# Patient Record
Sex: Male | Born: 1975 | Race: White | Hispanic: No | Marital: Single | Smoking: Never smoker
Health system: Southern US, Community
[De-identification: ages and names within clinical notes are randomized; demographics above are authoritative.]

## PROBLEM LIST (undated history)

## (undated) DIAGNOSIS — F410 Panic disorder [episodic paroxysmal anxiety] without agoraphobia: Secondary | ICD-10-CM

## (undated) DIAGNOSIS — F1910 Other psychoactive substance abuse, uncomplicated: Secondary | ICD-10-CM

## (undated) DIAGNOSIS — F419 Anxiety disorder, unspecified: Secondary | ICD-10-CM

## (undated) HISTORY — PX: HERNIA REPAIR: SHX51

---

## 2009-09-25 ENCOUNTER — Emergency Department (HOSPITAL_COMMUNITY): Admission: EM | Admit: 2009-09-25 | Discharge: 2009-09-27 | Payer: Self-pay | Admitting: Emergency Medicine

## 2010-04-15 LAB — POCT I-STAT, CHEM 8
Creatinine, Ser: 1.3 mg/dL (ref 0.4–1.5)
Glucose, Bld: 111 mg/dL — ABNORMAL HIGH (ref 70–99)
HCT: 36 % — ABNORMAL LOW (ref 39.0–52.0)
Hemoglobin: 12.2 g/dL — ABNORMAL LOW (ref 13.0–17.0)
Potassium: 3.4 mEq/L — ABNORMAL LOW (ref 3.5–5.1)
Sodium: 139 mEq/L (ref 135–145)
TCO2: 28 mmol/L (ref 0–100)

## 2010-04-15 LAB — RAPID URINE DRUG SCREEN, HOSP PERFORMED
Amphetamines: NOT DETECTED
Barbiturates: NOT DETECTED
Benzodiazepines: NOT DETECTED
Tetrahydrocannabinol: NOT DETECTED

## 2010-12-28 ENCOUNTER — Other Ambulatory Visit (HOSPITAL_COMMUNITY): Payer: Self-pay

## 2010-12-28 ENCOUNTER — Encounter: Payer: Self-pay | Admitting: *Deleted

## 2010-12-28 ENCOUNTER — Emergency Department (HOSPITAL_COMMUNITY): Payer: No Typology Code available for payment source

## 2010-12-28 ENCOUNTER — Emergency Department (HOSPITAL_COMMUNITY)
Admission: EM | Admit: 2010-12-28 | Discharge: 2010-12-28 | Disposition: A | Discharge status: Home / Self Care | Payer: No Typology Code available for payment source | Attending: Emergency Medicine | Admitting: Emergency Medicine

## 2010-12-28 ENCOUNTER — Other Ambulatory Visit (HOSPITAL_COMMUNITY): Payer: Self-pay | Admitting: Family Medicine

## 2010-12-28 DIAGNOSIS — K37 Unspecified appendicitis: Secondary | ICD-10-CM

## 2010-12-28 DIAGNOSIS — N134 Hydroureter: Secondary | ICD-10-CM | POA: Insufficient documentation

## 2010-12-28 DIAGNOSIS — R109 Unspecified abdominal pain: Secondary | ICD-10-CM | POA: Insufficient documentation

## 2010-12-28 HISTORY — DX: Anxiety disorder, unspecified: F41.9

## 2010-12-28 HISTORY — DX: Panic disorder (episodic paroxysmal anxiety): F41.0

## 2010-12-28 LAB — COMPREHENSIVE METABOLIC PANEL WITH GFR
BUN: 12 mg/dL (ref 6–23)
CO2: 26 meq/L (ref 19–32)
Calcium: 10.3 mg/dL (ref 8.4–10.5)
Creatinine, Ser: 1 mg/dL (ref 0.50–1.35)
GFR calc Af Amer: >90 mL/min (ref >90)
GFR calc non Af Amer: >90 mL/min (ref >90)
Glucose, Bld: 103 mg/dL — ABNORMAL HIGH (ref 70–99)
Total Bilirubin: 0.3 mg/dL (ref 0.3–1.2)

## 2010-12-28 LAB — COMPREHENSIVE METABOLIC PANEL
ALT: 25 U/L (ref 0–53)
AST: 23 U/L (ref 0–37)
Albumin: 4.6 g/dL (ref 3.5–5.2)
Alkaline Phosphatase: 46 U/L (ref 39–117)
Chloride: 105 mEq/L (ref 96–112)
Potassium: 4 mEq/L (ref 3.5–5.1)
Sodium: 142 mEq/L (ref 135–145)
Total Protein: 7.9 g/dL (ref 6.0–8.3)

## 2010-12-28 LAB — URINALYSIS, ROUTINE W REFLEX MICROSCOPIC
Bilirubin Urine: NEGATIVE
Glucose, UA: NEGATIVE mg/dL
Hgb urine dipstick: NEGATIVE
Ketones, ur: NEGATIVE mg/dL
Leukocytes, UA: NEGATIVE
Nitrite: NEGATIVE
Protein, ur: NEGATIVE mg/dL
Specific Gravity, Urine: 1.008 (ref 1.005–1.030)
Urobilinogen, UA: 0.2 mg/dL (ref 0.0–1.0)
pH: 7 (ref 5.0–8.0)

## 2010-12-28 LAB — CBC
HCT: 40.5 % (ref 39.0–52.0)
Hemoglobin: 14.7 g/dL (ref 13.0–17.0)
MCH: 31.8 pg (ref 26.0–34.0)
MCHC: 36.3 g/dL — ABNORMAL HIGH (ref 30.0–36.0)
MCV: 87.7 fL (ref 78.0–100.0)
Platelets: 204 10*3/uL (ref 150–400)
RBC: 4.62 MIL/uL (ref 4.22–5.81)
RDW: 12.4 % (ref 11.5–15.5)
WBC: 11.9 K/uL — ABNORMAL HIGH (ref 4.0–10.5)

## 2010-12-28 LAB — DIFFERENTIAL
Basophils Absolute: 0 K/uL (ref 0.0–0.1)
Basophils Relative: 0 % (ref 0–1)
Eosinophils Absolute: 0.1 10*3/uL (ref 0.0–0.7)
Eosinophils Relative: 0 % (ref 0–5)
Lymphocytes Relative: 14 % (ref 12–46)
Lymphs Abs: 1.6 K/uL (ref 0.7–4.0)
Monocytes Absolute: 0.6 K/uL (ref 0.1–1.0)
Monocytes Relative: 5 % (ref 3–12)
Neutro Abs: 9.6 10*3/uL — ABNORMAL HIGH (ref 1.7–7.7)
Neutrophils Relative %: 81 % — ABNORMAL HIGH (ref 43–77)

## 2010-12-28 LAB — LIPASE, BLOOD: Lipase: 130 U/L — ABNORMAL HIGH (ref 11–59)

## 2010-12-28 MED ORDER — IOHEXOL 300 MG/ML  SOLN
80.0000 mL | Freq: Once | INTRAMUSCULAR | Status: AC | PRN
  Administered 2010-12-28: 80 mL via INTRAVENOUS

## 2010-12-28 MED ORDER — HYDROMORPHONE HCL PF 1 MG/ML IJ SOLN
1.0000 mg | Freq: Once | INTRAMUSCULAR | Status: AC
  Administered 2010-12-28: 1 mg via INTRAVENOUS
  Filled 2010-12-28: qty 1

## 2010-12-28 MED ORDER — HYDROCODONE-ACETAMINOPHEN 5-500 MG PO TABS: 1.0000 | ORAL_TABLET | Freq: Four times a day (QID) | ORAL | Status: AC | PRN

## 2010-12-28 MED ORDER — ONDANSETRON HCL 4 MG/2ML IJ SOLN
4.0000 mg | Freq: Once | INTRAMUSCULAR | Status: AC
  Administered 2010-12-28: 4 mg via INTRAVENOUS
  Filled 2010-12-28: qty 2

## 2010-12-28 MED ORDER — LORAZEPAM 2 MG/ML IJ SOLN
0.5000 mg | Freq: Once | INTRAMUSCULAR | Status: AC
  Administered 2010-12-28: 0.5 mg via INTRAVENOUS
  Filled 2010-12-28: qty 1

## 2010-12-28 MED ORDER — SODIUM CHLORIDE 0.9 % IV SOLN
Freq: Once | INTRAVENOUS | Status: AC
  Administered 2010-12-28: 15:00:00 via INTRAVENOUS

## 2010-12-28 NOTE — ED Notes (Signed)
Pt states "I had pain that doubled me over, I went to Urgent Care & they drew some blood, told me my WBC's were elevated, I needed a CT scan and were 95% sure it was appenditicitis"

## 2010-12-28 NOTE — ED Notes (Signed)
Patient transported to CT 

## 2010-12-28 NOTE — ED Provider Notes (Signed)
History     CSN: [id number] Arrival date & time: 12/28/2010  2:03 PM   First MD Initiated Contact with Patient 12/28/10 1432      Chief Complaint  Patient presents with  . Abdominal Pain    (Consider location/radiation/quality/duration/timing/severity/associated sxs/prior treatment) HPI  Past Medical History  Diagnosis Date  . Anxiety   . Panic attacks     Past Surgical History  Procedure Date  . Hernia repair     No family history on file.  History  Substance Use Topics  . Smoking status: Former Games developer  . Smokeless tobacco: Not on file  . Alcohol Use: No      Review of Systems  Allergies  Review of patient's allergies indicates no known allergies.  Home Medications   Current Outpatient Rx  Name Route Sig Dispense Refill  . BUPROPION HCL ER (SR) 150 MG PO TB12 Oral Take 150 mg by mouth 2 (two) times daily.      Marland Kitchen CLONAZEPAM 1 MG PO TABS Oral Take 1 mg by mouth 2 (two) times daily.     Carma Leaven M PLUS PO TABS Oral Take 1 tablet by mouth daily. Complete Man     . QUETIAPINE FUMARATE 50 MG PO TABS Oral Take 50 mg by mouth at bedtime.        BP 121/71  Pulse 72  Temp(Src) 98.5 F (36.9 C) (Oral)  Resp 18  Wt 177 lb (80.287 kg)  SpO2 98%  Physical Exam  ED Course  Procedures (including critical care time)  Labs Reviewed  CBC - Abnormal; Notable for the following:    WBC 11.9 (*)    MCHC 36.3 (*)    All other components within normal limits  DIFFERENTIAL - Abnormal; Notable for the following:    Neutrophils Relative 81 (*)    Neutro Abs 9.6 (*)    All other components within normal limits  COMPREHENSIVE METABOLIC PANEL - Abnormal; Notable for the following:    Glucose, Bld 103 (*)    All other components within normal limits  LIPASE, BLOOD - Abnormal; Notable for the following:    Lipase 130 (*)    All other components within normal limits  URINALYSIS, ROUTINE W REFLEX MICROSCOPIC   Ct Abdomen Pelvis W Contrast  12/28/2010  *RADIOLOGY  REPORT*  Clinical Data: Right-sided abdominal pain.  CT ABDOMEN AND PELVIS WITH CONTRAST  Technique:  Multidetector CT imaging of the abdomen and pelvis was performed following the standard protocol during bolus administration of intravenous contrast.  Contrast: 80mL OMNIPAQUE IOHEXOL 300 MG/ML IV SOLN  Comparison: None.  Findings: The liver, spleen, pancreas, and adrenal glands appear unremarkable.  The gallbladder and biliary system appear unremarkable.  Bilateral extrarenal pelvis noted with borderline fullness of the collecting system but no caliectasis. Borderline right proximal hydroureter noted.  Prominent urinary bladder fills the pelvis, with volume of bladder estimated at 570 ml.  No appendiceal inflammation is observed.  There is contrast or appendicolith within the appendix.  Bilateral inguinal hernia mesh noted.  No free pelvic fluid is observed.  Prostate gland is normal and sinus.  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  A broad Schmorl's node along the anterior endplate of S1 causes a scalloped appearance.  Diffuse disc bulge noted at the L4-5 level.  IMPRESSION:  1.  Mild collecting system prominence and mild right hydroureter, with a moderately distended urinary bladder.  No overt  hydronephrosis.  No stones identified. 2.  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 3.  Diffuse disc bulge at L4-5, without observed impingement. 4.  The appendix appears unremarkable. 5.  Bilateral prior inguinal hernia repairs without recurrent hernia observed.  Original Report Authenticated By: Dellia Cloud, M.D.     No diagnosis found.    MDM  I assumed care from Dr. Oletta Lamas.  I agree with his note, assessment, and plan as above.  The CT came back without evidence for appendicitis.  There was mild right hydroureter but no sign of kidney stone.  This raises the suspicion that possibly a stone was  passed.  Will treat with pain meds, to return prn.          Geoffery Lyons, MD 12/28/10 [phone]

## 2010-12-28 NOTE — ED Provider Notes (Signed)
History     CSN: [id number] Arrival date & time: 12/28/2010  2:03 PM   First MD Initiated Contact with Patient 12/28/10 1432      Chief Complaint  Patient presents with  . Abdominal Pain    (Consider location/radiation/quality/duration/timing/severity/associated sxs/prior treatment) HPI Comments: Patient reports that he was woken at approximately 6:30 this morning do to diffuse abdominal pain. He had some mild nausea but no vomiting. She reports that eventually he had to go see the urgent care to 2 more severe pain. He reports pain is much worse with moving around or tries to sit up using his abdominal muscles. He denies any diarrhea or constipation. He denies any fevers or chills. His appetite is mildly decreased. There is a did a plain x-rays as well as blood tests and told him that his white count was mildly elevated and that they suspected that he might have appendicitis and directed him here to the emergency department. He reports pain is somewhat improved since it began although it is still rather moderate to severe in nature. It does not radiate to his back. He says it mildly radiates to his groin area in the suprapubic and right groin region but denies testicular or scrotal pain. Denies hematuria or dysuria. He denies any recent trauma. He has a significant history of anxiety and depression, and he also has had bilateral inguinal hernia surgeries a couple years ago in [place]. He reports that he is a former smoker he does not drink alcohol.  Patient is a 35 y.o. male presenting with abdominal pain. The history is provided by the patient.  Abdominal Pain The primary symptoms of the illness include abdominal pain and nausea. The primary symptoms of the illness do not include fever, shortness of breath or vomiting.  Symptoms associated with the illness do not include chills, hematuria, frequency or back pain.    Past Medical History  Diagnosis Date  . Anxiety   . Panic attacks      Past Surgical History  Procedure Date  . Hernia repair     No family history on file.  History  Substance Use Topics  . Smoking status: Former Games developer  . Smokeless tobacco: Not on file  . Alcohol Use: No      Review of Systems  Constitutional: Negative for fever and chills.  HENT: Negative for congestion and rhinorrhea.   Respiratory: Negative for shortness of breath.   Gastrointestinal: Positive for nausea and abdominal pain. Negative for vomiting, blood in stool and rectal pain.  Genitourinary: Negative for frequency, hematuria, flank pain, scrotal swelling and testicular pain.  Musculoskeletal: Negative for back pain.  All other systems reviewed and are negative.    Allergies  Review of patient's allergies indicates no known allergies.  Home Medications   Current Outpatient Rx  Name Route Sig Dispense Refill  . BUPROPION HCL ER (SR) 150 MG PO TB12 Oral Take 150 mg by mouth 2 (two) times daily.      Marland Kitchen CLONAZEPAM 1 MG PO TABS Oral Take 1 mg by mouth 2 (two) times daily.     Carma Leaven M PLUS PO TABS Oral Take 1 tablet by mouth daily. Complete Man     . QUETIAPINE FUMARATE 50 MG PO TABS Oral Take 50 mg by mouth at bedtime.      Marland Kitchen HYDROCODONE-ACETAMINOPHEN 5-500 MG PO TABS Oral Take 1-2 tablets by mouth every 6 (six) hours as needed for pain. 15 tablet 0    BP 121/71  Pulse 72  Temp(Src) 98.5 F (36.9 C) (Oral)  Resp 18  Wt 177 lb (80.287 kg)  SpO2 98%  Physical Exam  Nursing note and vitals reviewed. Constitutional: He appears well-developed and well-nourished.  HENT:  Head: Normocephalic and atraumatic.  Eyes: Pupils are equal, round, and reactive to light. No scleral icterus.  Abdominal: Soft. Bowel sounds are normal. There is tenderness in the right lower quadrant, suprapubic area and left lower quadrant. There is rebound, guarding and tenderness at McBurney's point. There is no CVA tenderness. No hernia.  Psychiatric: He has a normal mood and affect. His  speech is normal. Thought content normal.    ED Course  Procedures (including critical care time)  Labs Reviewed  CBC - Abnormal; Notable for the following:    WBC 11.9 (*)    MCHC 36.3 (*)    All other components within normal limits  DIFFERENTIAL - Abnormal; Notable for the following:    Neutrophils Relative 81 (*)    Neutro Abs 9.6 (*)    All other components within normal limits  COMPREHENSIVE METABOLIC PANEL - Abnormal; Notable for the following:    Glucose, Bld 103 (*)    All other components within normal limits  LIPASE, BLOOD - Abnormal; Notable for the following:    Lipase 130 (*)    All other components within normal limits  URINALYSIS, ROUTINE W REFLEX MICROSCOPIC  LAB REPORT - SCANNED   Ct Abdomen Pelvis W Contrast  12/28/2010  *RADIOLOGY REPORT*  Clinical Data: Right-sided abdominal pain.  CT ABDOMEN AND PELVIS WITH CONTRAST  Technique:  Multidetector CT imaging of the abdomen and pelvis was performed following the standard protocol during bolus administration of intravenous contrast.  Contrast: 80mL OMNIPAQUE IOHEXOL 300 MG/ML IV SOLN  Comparison: None.  Findings: The liver, spleen, pancreas, and adrenal glands appear unremarkable.  The gallbladder and biliary system appear unremarkable.  Bilateral extrarenal pelvis noted with borderline fullness of the collecting system but no caliectasis. Borderline right proximal hydroureter noted.  Prominent urinary bladder fills the pelvis, with volume of bladder estimated at 570 ml.  No appendiceal inflammation is observed.  There is contrast or appendicolith within the appendix.  Bilateral inguinal hernia mesh noted.  No free pelvic fluid is observed.  Prostate gland is normal and sinus.  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  A broad Schmorl's node along the anterior endplate of S1 causes a scalloped appearance.  Diffuse disc bulge noted at the L4-5 level.   IMPRESSION:  1.  Mild collecting system prominence and mild right hydroureter, with a moderately distended urinary bladder.  No overt hydronephrosis.  No stones identified. 2.  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 3.  Diffuse disc bulge at L4-5, without observed impingement. 4.  The appendix appears unremarkable. 5.  Bilateral prior inguinal hernia repairs without recurrent hernia observed.  Original Report Authenticated By: Dellia Cloud, M.D.     1. Abdominal pain       MDM    Patient with some guarding with most of his tenderness in the right lower quadrant at McBurney's point. He does have a positive obturator sign as well. Plan is to obtain blood tests again and ordered a CT scan with contrast to assess for appendicitis. He is afebrile and nontoxic appearing otherwise with normal blood pressure. He is given IV fluids and IV analgesics as well as  IV Zofran for her symptoms at this time.   Pt was signed out to Dr. Judd Lien who assumes care.     Gavin Pound. Keiji Melland, MD 12/29/10 1005

## 2011-05-18 ENCOUNTER — Encounter (HOSPITAL_COMMUNITY): Payer: Self-pay | Admitting: *Deleted

## 2011-05-18 ENCOUNTER — Emergency Department (HOSPITAL_COMMUNITY)
Admission: EM | Admit: 2011-05-18 | Discharge: 2011-05-19 | Disposition: A | Discharge status: Home / Self Care | Payer: No Typology Code available for payment source | Attending: Emergency Medicine | Admitting: Emergency Medicine

## 2011-05-18 DIAGNOSIS — R51 Headache: Secondary | ICD-10-CM | POA: Insufficient documentation

## 2011-05-18 DIAGNOSIS — K5289 Other specified noninfective gastroenteritis and colitis: Secondary | ICD-10-CM | POA: Insufficient documentation

## 2011-05-18 DIAGNOSIS — K529 Noninfective gastroenteritis and colitis, unspecified: Secondary | ICD-10-CM

## 2011-05-18 MED ORDER — SODIUM CHLORIDE 0.9 % IV BOLUS (SEPSIS)
1000.0000 mL | Freq: Once | INTRAVENOUS | Status: AC
  Administered 2011-05-19: 1000 mL via INTRAVENOUS

## 2011-05-18 MED ORDER — ONDANSETRON HCL 4 MG/2ML IJ SOLN
4.0000 mg | Freq: Once | INTRAMUSCULAR | Status: AC
  Administered 2011-05-19: 4 mg via INTRAVENOUS
  Filled 2011-05-18: qty 2

## 2011-05-18 NOTE — ED Notes (Signed)
Patient states that he thinks he has the virus.  Patient has been vomiting today and cannot keep anything down

## 2011-05-19 LAB — COMPREHENSIVE METABOLIC PANEL
Alkaline Phosphatase: 42 U/L (ref 39–117)
BUN: 21 mg/dL (ref 6–23)
Chloride: 98 mEq/L (ref 96–112)
GFR calc Af Amer: >90 mL/min (ref >90)
Glucose, Bld: 99 mg/dL (ref 70–99)
Potassium: 3.8 mEq/L (ref 3.5–5.1)
Total Bilirubin: 0.5 mg/dL (ref 0.3–1.2)
Total Protein: 7.3 g/dL (ref 6.0–8.3)

## 2011-05-19 LAB — DIFFERENTIAL
Eosinophils Absolute: 0 10*3/uL (ref 0.0–0.7)
Lymphs Abs: 1.2 10*3/uL (ref 0.7–4.0)
Monocytes Relative: 10 % (ref 3–12)
Neutrophils Relative %: 81 % — ABNORMAL HIGH (ref 43–77)

## 2011-05-19 LAB — CBC
HCT: 37.1 % — ABNORMAL LOW (ref 39.0–52.0)
Hemoglobin: 13.4 g/dL (ref 13.0–17.0)
MCH: 31.7 pg (ref 26.0–34.0)
RBC: 4.23 MIL/uL (ref 4.22–5.81)

## 2011-05-19 LAB — LIPASE, BLOOD: Lipase: 20 U/L (ref 11–59)

## 2011-05-19 MED ORDER — PROMETHAZINE HCL 25 MG RE SUPP: 25.0000 mg | Freq: Four times a day (QID) | RECTAL | Status: DC | PRN

## 2011-05-19 MED ORDER — PROMETHAZINE HCL 25 MG PO TABS: 25.0000 mg | ORAL_TABLET | Freq: Four times a day (QID) | ORAL | Status: DC | PRN

## 2011-05-19 MED ORDER — SODIUM CHLORIDE 0.9 % IV BOLUS (SEPSIS)
1000.0000 mL | Freq: Once | INTRAVENOUS | Status: AC
  Administered 2011-05-19: 1000 mL via INTRAVENOUS

## 2011-05-19 MED ORDER — PROMETHAZINE HCL 25 MG/ML IJ SOLN
25.0000 mg | Freq: Once | INTRAMUSCULAR | Status: AC
  Administered 2011-05-19: 25 mg via INTRAVENOUS
  Filled 2011-05-19: qty 1

## 2011-05-19 MED ORDER — ONDANSETRON HCL 4 MG/2ML IJ SOLN
4.0000 mg | Freq: Once | INTRAMUSCULAR | Status: AC
  Administered 2011-05-19: 4 mg via INTRAVENOUS
  Filled 2011-05-19: qty 2

## 2011-05-19 MED ORDER — PANTOPRAZOLE SODIUM 40 MG IV SOLR
40.0000 mg | Freq: Once | INTRAVENOUS | Status: AC
  Administered 2011-05-19: 40 mg via INTRAVENOUS
  Filled 2011-05-19: qty 40

## 2011-05-19 NOTE — Discharge Instructions (Signed)
B.R.A.T. Diet Your doctor has recommended the B.R.A.T. diet for you or your child until the condition improves. This is often used to help control diarrhea and vomiting symptoms. If you or your child can tolerate clear liquids, you may have:  Bananas.   Rice.   Applesauce.   Toast (and other simple starches such as crackers, potatoes, noodles).  Be sure to avoid dairy products, meats, and fatty foods until symptoms are better. Fruit juices such as apple, grape, and prune juice can make diarrhea worse. Avoid these. Continue this diet for 2 days or as instructed by your caregiver. Document Released: 01/16/2005 Document Revised: 01/05/2011 Document Reviewed: 07/05/2006 [facility] Patient Information 2012 [place].Clear Liquid Diet The clear liquid dietconsists of foods that are liquid or will become liquid at room temperature.You should be able to see through the liquid and beverages. Examples of foods allowed on a clear liquid diet include fruit juice, broth or bouillon, gelatin, or frozen ice pops. The purpose of this diet is to provide necessary fluid, electrolytes such as sodium and potassium, and energy to keep the body functioning during times when you are not able to consume a regular diet.A clear liquid diet should not be continued for long periods of time as it is not nutritionally adequate.  REASONS FOR USING A CLEAR LIQUID DIET  In sudden onset (acute) conditions for a patient before or after surgery.   As the first step in oral feeding.   For fluid and electrolyte replacement in diarrheal diseases.   As a diet before certain medical tests are performed.  ADEQUACY The clear liquid diet is adequate only in ascorbic acid, according to the Recommended Dietary Allowances of the [employer]. CHOOSING FOODS Breads and Starches  Allowed:  None are allowed.   Avoid: All are avoided.  Vegetables  Allowed:  Strained tomato or vegetable juice.   Avoid: Any  others.  Fruit  Allowed:  Strained fruit juices and fruit drinks. Include 1 serving of citrus or vitamin C-enriched fruit juice daily.   Avoid: Any others.  Meat and Meat Substitutes  Allowed:  None are allowed.   Avoid: All are avoided.  Milk  Allowed:  None are allowed.   Avoid: All are avoided.  Soups and Combination Foods  Allowed:  Clear bouillon, broth, or strained broth-based soups.   Avoid: Any others.  Desserts and Sweets  Allowed:  Sugar, honey. High protein gelatin. Flavored gelatin, ices, or frozen ice pops that do not contain milk.   Avoid: Any others.  Fats and Oils  Allowed:  None are allowed.   Avoid: All are avoided.  Beverages  Allowed: Cereal beverages, coffee (regular or decaffeinated), tea, or soda at the discretion of your caregiver.   Avoid: Any others.  Condiments  Allowed:  Iodized salt.   Avoid: Any others, including pepper.  Supplements  Allowed:  Liquid nutrition beverages.   Avoid: Any others that contain lactose or fiber.  SAMPLE MEAL PLAN Breakfast  4 oz (120 mL) strained orange juice.    to 1 cup (125 to 250 mL) gelatin (plain or fortified).   1 cup (250 mL) beverage (coffee or tea).   Sugar, if desired.  Midmorning Snack   cup (125 mL) gelatin (plain or fortified).  Lunch  1 cup (250 mL) broth or consomm.   4 oz (120 mL) strained grapefruit juice.    cup (125 mL) gelatin (plain or fortified).   1 cup (250 mL) beverage (coffee or tea).  250 mL) beverage (coffee or tea).    Sugar, if desired.   Midmorning Snack    cup (125 mL) gelatin (plain or fortified).   Lunch   1 cup (250 mL) broth or consomm.    4 oz (120 mL) strained grapefruit juice.     cup (125 mL) gelatin (plain or fortified).    1 cup (250 mL) beverage (coffee or tea).    Sugar, if desired.   Midafternoon Snack    cup (125 mL) fruit ice.     cup (125 mL) strained fruit juice.   Dinner   1 cup (250 mL) broth or consomm.     cup (125 mL) cranberry juice.     cup (125 mL) flavored gelatin (plain or fortified).    1 cup (250 mL) beverage (coffee or tea).    Sugar, if desired.   Evening Snack   4 oz (120 mL) strained apple juice (vitamin C-fortified).     cup (125 mL) flavored gelatin (plain or fortified).    Document Released: 01/16/2005 Document Revised: 01/05/2011 Document Reviewed: 04/15/2010  [employer] Patient Information 2012 [employer].    Diet for Diarrhea, Adult  Having frequent, runny stools (diarrhea) has many causes. Diarrhea may be caused or worsened by food or drink. Diarrhea may be relieved by changing your diet.  IF YOU ARE NOT TOLERATING SOLID FOODS:   Drink enough water and fluids to keep your urine clear or pale yellow.    Avoid sugary drinks and sodas as well as milk-based beverages.    Avoid beverages containing caffeine and alcohol.    You may try rehydrating beverages. You can make your own by following this recipe:     tsp table salt.     tsp baking soda.    ? tsp salt substitute (potassium chloride).    1 tbs + 1 tsp sugar.    1 qt water.   As your stools become more solid, you can start eating solid foods. Add foods one at a time. If a certain food causes your diarrhea to get worse, avoid that food and try other foods. A low fiber, low-fat, and lactose-free diet is recommended. Small, frequent meals may be better tolerated.    Starches   Allowed:  White, French, and pita breads, plain rolls, buns, bagels. Plain muffins, matzo. Soda, saltine, or graham crackers. Pretzels, melba toast, zwieback. Cooked cereals made with water: cornmeal, farina, cream cereals. Dry cereals: refined corn, wheat, rice. Potatoes prepared any way without skins, refined macaroni, spaghetti, noodles, refined rice.    Avoid:  Bread, rolls, or crackers made with whole wheat, multi-grains, rye, bran seeds, nuts, or coconut. Corn tortillas or taco shells. Cereals containing whole grains, multi-grains, bran, coconut, nuts, or raisins. Cooked or dry oatmeal. Coarse wheat cereals, granola. Cereals advertised as "high-fiber." Potato skins. Whole grain pasta, wild or brown rice. Popcorn. Sweet potatoes/yams. Sweet rolls, doughnuts, waffles, pancakes, sweet breads.   Vegetables    Allowed: Strained tomato and vegetable juices. Most well-cooked and canned vegetables without seeds. Fresh: Tender lettuce, cucumber without the skin, cabbage, spinach, bean sprouts.    Avoid: Fresh, cooked, or canned: Artichokes, baked beans, beet greens, broccoli, Brussels sprouts, corn, kale, legumes, peas, sweet potatoes. Cooked: Green or red cabbage, spinach. Avoid large servings of any vegetables, because vegetables shrink when cooked, and they contain more fiber per serving than fresh vegetables.   Fruit   Allowed: All fruit juices except prune juice. Cooked or canned: Apricots, applesauce, cantaloupe,   lentils.  Milk  Allowed: Yogurt, lactose-free milk, kefir, drinkable yogurt, buttermilk, soy milk.   Avoid: Milk, chocolate milk, beverages made with milk, such as milk shakes.  Soups  Allowed: Bouillon, broth, or soups made from allowed foods. Any strained soup.   Avoid: Soups made from vegetables that are not allowed, cream or milk-based soups.  Desserts and Sweets  Allowed: Sugar-free gelatin, sugar-free frozen ice pops made without sugar alcohol.   Avoid: Plain cakes and cookies, pie made with allowed fruit, pudding, custard, cream pie. Gelatin, fruit, ice, sherbet, frozen ice pops. Ice cream, ice  milk without nuts. Plain hard candy, honey, jelly, molasses, syrup, sugar, chocolate syrup, gumdrops, marshmallows.  Fats and Oils  Allowed: Avoid any fats and oils.   Avoid: Seeds, nuts, olives, avocados. Margarine, butter, cream, mayonnaise, salad oils, plain salad dressings made from allowed foods. Plain gravy, crisp bacon without rind.  Beverages  Allowed: Water, decaffeinated teas, oral rehydration solutions, sugar-free beverages.   Avoid: Fruit juices, caffeinated beverages (coffee, tea, soda or pop), alcohol, sports drinks, or lemon-lime soda or pop.  Condiments  Allowed: Ketchup, mustard, horseradish, vinegar, cream sauce, cheese sauce, cocoa powder. Spices in moderation: allspice, basil, bay leaves, celery powder or leaves, cinnamon, cumin powder, curry powder, ginger, mace, marjoram, onion or garlic powder, oregano, paprika, parsley flakes, ground pepper, rosemary, sage, savory, tarragon, thyme, turmeric.   Avoid: Coconut, honey.  Weight Monitoring: Weigh yourself every day. You should weigh yourself in the morning after you urinate and before you eat breakfast. Wear the same amount of clothing when you weigh yourself. Record your weight daily. Bring your recorded weights to your clinic visits. Tell your caregiver right away if you have gained 3 lb/1.4 kg or more in 1 day, 5 lb/2.3 kg in a week, or whatever amount you were told to report. SEEK IMMEDIATE MEDICAL CARE IF:   You are unable to keep fluids down.   You start to throw up (vomit) or diarrhea keeps coming back (persistent).   Abdominal pain develops, increases, or can be felt in one place (localizes).   You have an oral temperature above 102 F (38.9 C), not controlled by medicine.   Diarrhea contains blood or mucus.   You develop excessive weakness, dizziness, fainting, or extreme thirst.  MAKE SURE YOU:   Understand these instructions.   Will watch your condition.   Will get help right away if you are not  doing well or get worse.  Document Released: 04/08/2003 Document Revised: 01/05/2011 Document Reviewed: 07/30/2008 [facility] Patient Information 2012 [place], [place].

## 2011-05-19 NOTE — ED Provider Notes (Signed)
Medical screening examination/treatment/procedure(s) were conducted as a shared visit with non-physician practitioner(s) and myself.  I personally evaluated the patient during the encounter  Doug Sou, MD 05/19/11 [phone]

## 2011-05-19 NOTE — ED Notes (Signed)
Patient is resting comfortably. 

## 2011-05-19 NOTE — ED Provider Notes (Signed)
History     CSN: [id number]  Arrival date & time 05/18/11  2143   First MD Initiated Contact with Patient 05/18/11 2345      Chief Complaint  Patient presents with  . Emesis    (Consider location/radiation/quality/duration/timing/severity/associated sxs/prior treatment) HPI Comments: Patient here with a day history of nausea, vomiting and diarrhea - states that his children have all been sick with a "virus" and he thinks that he has caught it too - states multiple (about 8) episodes of NBNB vomiting - has been unable to keep even sips of water or juice down.  Denies fever or chills, denies abdominal pain, blood in vomit or diarrhea - states  No other sick contacts besides children.  Denies recent antibiotic use or travel outside the [place].  Patient is a 36 y.o. male presenting with vomiting. The history is provided by the patient and the spouse. No language interpreter was used.  Emesis  This is a new problem. The current episode started 12 to 24 hours ago. The problem occurs 5 to 10 times per day. The problem has not changed since onset.The emesis has an appearance of stomach contents. There has been no fever. Associated symptoms include diarrhea and headaches. Pertinent negatives include no abdominal pain, no arthralgias, no chills, no cough, no fever, no myalgias, no sweats and no URI. Risk factors include ill contacts.    Past Medical History  Diagnosis Date  . Anxiety   . Panic attacks     Past Surgical History  Procedure Date  . Hernia repair     History reviewed. No pertinent family history.  History  Substance Use Topics  . Smoking status: Former Games developer  . Smokeless tobacco: Not on file  . Alcohol Use: No      Review of Systems  Constitutional: Negative for fever and chills.  Respiratory: Negative for cough.   Gastrointestinal: Positive for vomiting and diarrhea. Negative for abdominal pain.  Musculoskeletal: Negative for myalgias and arthralgias.  Neurological:  Positive for headaches.  All other systems reviewed and are negative.    Allergies  Review of patient's allergies indicates no known allergies.  Home Medications   Current Outpatient Rx  Name Route Sig Dispense Refill  . BUPROPION HCL ER (SR) 150 MG PO TB12 Oral Take 150 mg by mouth 2 (two) times daily.     Marland Kitchen CLONAZEPAM 1 MG PO TABS Oral Take 1 mg by mouth 2 (two) times daily.     Marland Kitchen HYDROCODONE-ACETAMINOPHEN 10-325 MG PO TABS Oral Take 1 tablet by mouth every 6 (six) hours as needed. For pain    . THERA M PLUS PO TABS Oral Take 1 tablet by mouth daily. Complete Man     . QUETIAPINE FUMARATE 50 MG PO TABS Oral Take 50 mg by mouth at bedtime.        BP 106/68  Pulse 91  Temp(Src) 98.3 F (36.8 C) (Oral)  Resp 20  SpO2 97%  Physical Exam  Nursing note and vitals reviewed. Constitutional: He is oriented to person, place, and time. He appears well-developed and well-nourished. No distress.  HENT:  Head: Normocephalic and atraumatic.  Right Ear: External ear normal.  Left Ear: External ear normal.  Nose: Nose normal.  Mouth/Throat: No oropharyngeal exudate.       Dry mucous membranes  Eyes: Conjunctivae are normal. Pupils are equal, round, and reactive to light. No scleral icterus.  Neck: Normal range of motion. Neck supple.  Cardiovascular: Normal rate, regular rhythm and  normal heart sounds.  Exam reveals no gallop and no friction rub.   No murmur heard. Pulmonary/Chest: Effort normal and breath sounds normal. No respiratory distress. He has no wheezes. He has no rales. He exhibits no tenderness.  Abdominal: Soft. Bowel sounds are normal. He exhibits no distension and no mass. There is no tenderness. There is no rebound and no guarding.  Musculoskeletal: Normal range of motion. He exhibits no edema and no tenderness.  Lymphadenopathy:    He has no cervical adenopathy.  Neurological: He is alert and oriented to person, place, and time. No cranial nerve deficit.  Skin: Skin is  warm and dry. No rash noted. No erythema. No pallor.  Psychiatric: He has a normal mood and affect. His behavior is normal. Judgment and thought content normal.    ED Course  Procedures (including critical care time)  Labs Reviewed  CBC - Abnormal; Notable for the following:    WBC 13.1 (*)    HCT 37.1 (*)    MCHC 36.1 (*)    All other components within normal limits  DIFFERENTIAL - Abnormal; Notable for the following:    Neutrophils Relative 81 (*)    Neutro Abs 10.6 (*)    Lymphocytes Relative 9 (*)    Monocytes Absolute 1.3 (*)    All other components within normal limits  COMPREHENSIVE METABOLIC PANEL  LIPASE, BLOOD   No results found.  Results for orders placed during the hospital encounter of 05/18/11  CBC      Component Value Range   WBC 13.1 (*) 4.0 - 10.5 (K/uL)   RBC 4.23  4.22 - 5.81 (MIL/uL)   Hemoglobin 13.4  13.0 - 17.0 (g/dL)   HCT 86.5 (*) 78.4 - 52.0 (%)   MCV 87.7  78.0 - 100.0 (fL)   MCH 31.7  26.0 - 34.0 (pg)   MCHC 36.1 (*) 30.0 - 36.0 (g/dL)   RDW 69.6  29.5 - 28.4 (%)   Platelets 189  150 - 400 (K/uL)  DIFFERENTIAL      Component Value Range   Neutrophils Relative 81 (*) 43 - 77 (%)   Neutro Abs 10.6 (*) 1.7 - 7.7 (K/uL)   Lymphocytes Relative 9 (*) 12 - 46 (%)   Lymphs Abs 1.2  0.7 - 4.0 (K/uL)   Monocytes Relative 10  3 - 12 (%)   Monocytes Absolute 1.3 (*) 0.1 - 1.0 (K/uL)   Eosinophils Relative 0  0 - 5 (%)   Eosinophils Absolute 0.0  0.0 - 0.7 (K/uL)   Basophils Relative 0  0 - 1 (%)   Basophils Absolute 0.0  0.0 - 0.1 (K/uL)  COMPREHENSIVE METABOLIC PANEL      Component Value Range   Sodium 137  135 - 145 (mEq/L)   Potassium 3.8  3.5 - 5.1 (mEq/L)   Chloride 98  96 - 112 (mEq/L)   CO2 24  19 - 32 (mEq/L)   Glucose, Bld 99  70 - 99 (mg/dL)   BUN 21  6 - 23 (mg/dL)   Creatinine, Ser 1.32  0.50 - 1.35 (mg/dL)   Calcium 9.8  8.4 - 44.0 (mg/dL)   Total Protein 7.3  6.0 - 8.3 (g/dL)   Albumin 4.7  3.5 - 5.2 (g/dL)   AST 27  0 - 37  (U/L)   ALT 27  0 - 53 (U/L)   Alkaline Phosphatase 42  39 - 117 (U/L)   Total Bilirubin 0.5  0.3 - 1.2 (mg/dL)  GFR calc non Af Amer >90  >90 (mL/min)   GFR calc Af Amer >90  >90 (mL/min)  LIPASE, BLOOD      Component Value Range   Lipase 20  11 - 59 (U/L)   No results found.   Gastroenteritis     MDM  Patient with no focal findings suggestive of abdominal surgical finding.  Reports no nausea and vomiting after last dose of phenergan - will send home with the same - able to tolerate po fluids.        Izola Price [place], [place] 05/19/11 0330

## 2011-08-09 ENCOUNTER — Encounter (HOSPITAL_COMMUNITY): Payer: Self-pay | Admitting: *Deleted

## 2011-08-09 ENCOUNTER — Emergency Department (HOSPITAL_COMMUNITY)
Admission: EM | Admit: 2011-08-09 | Discharge: 2011-08-09 | Disposition: A | Discharge status: Home / Self Care | Payer: No Typology Code available for payment source | Attending: Emergency Medicine | Admitting: Emergency Medicine

## 2011-08-09 ENCOUNTER — Emergency Department (HOSPITAL_COMMUNITY): Payer: No Typology Code available for payment source

## 2011-08-09 DIAGNOSIS — R300 Dysuria: Secondary | ICD-10-CM | POA: Insufficient documentation

## 2011-08-09 DIAGNOSIS — K5900 Constipation, unspecified: Secondary | ICD-10-CM | POA: Insufficient documentation

## 2011-08-09 DIAGNOSIS — R1031 Right lower quadrant pain: Secondary | ICD-10-CM | POA: Insufficient documentation

## 2011-08-09 LAB — CBC WITH DIFFERENTIAL/PLATELET
Lymphocytes Relative: 20 % (ref 12–46)
Lymphs Abs: 1.6 10*3/uL (ref 0.7–4.0)
MCV: 87.3 fL (ref 78.0–100.0)
Neutro Abs: 5.3 10*3/uL (ref 1.7–7.7)
Neutrophils Relative %: 67 % (ref 43–77)
Platelets: 195 10*3/uL (ref 150–400)
RBC: 4.55 MIL/uL (ref 4.22–5.81)
WBC: 8 10*3/uL (ref 4.0–10.5)

## 2011-08-09 LAB — URINALYSIS, ROUTINE W REFLEX MICROSCOPIC
Hgb urine dipstick: NEGATIVE
Leukocytes, UA: NEGATIVE
Nitrite: NEGATIVE
Specific Gravity, Urine: 1.016 (ref 1.005–1.030)
Urobilinogen, UA: 0.2 mg/dL (ref 0.0–1.0)

## 2011-08-09 LAB — BASIC METABOLIC PANEL
CO2: 22 mEq/L (ref 19–32)
Chloride: 100 mEq/L (ref 96–112)
Glucose, Bld: 94 mg/dL (ref 70–99)
Potassium: 4.2 mEq/L (ref 3.5–5.1)
Sodium: 136 mEq/L (ref 135–145)

## 2011-08-09 MED ORDER — HYDROMORPHONE HCL PF 1 MG/ML IJ SOLN
1.0000 mg | Freq: Once | INTRAMUSCULAR | Status: AC
  Administered 2011-08-09: 1 mg via INTRAVENOUS
  Filled 2011-08-09: qty 1

## 2011-08-09 MED ORDER — HYDROCODONE-ACETAMINOPHEN 5-325 MG PO TABS: 1.0000 | ORAL_TABLET | Freq: Four times a day (QID) | ORAL | Status: AC | PRN

## 2011-08-09 MED ORDER — POLYETHYLENE GLYCOL 3350 17 G PO PACK: 17.0000 g | PACK | Freq: Every day | ORAL | Status: AC

## 2011-08-09 MED ORDER — BISACODYL 5 MG PO TBEC: 5.0000 mg | DELAYED_RELEASE_TABLET | Freq: Two times a day (BID) | ORAL | Status: AC

## 2011-08-09 MED ORDER — ONDANSETRON HCL 4 MG/2ML IJ SOLN
4.0000 mg | Freq: Once | INTRAMUSCULAR | Status: AC
  Administered 2011-08-09: 4 mg via INTRAVENOUS
  Filled 2011-08-09: qty 2

## 2011-08-09 NOTE — ED Notes (Addendum)
Sudden onset of lower abd pain and pain with urination, able to urinated just a little with the pain, has history of kidney stones

## 2011-08-09 NOTE — ED Provider Notes (Signed)
History     CSN: [id number]  Arrival date & time 08/09/11  1611   First MD Initiated Contact with Patient 08/09/11 1753      Chief Complaint  Patient presents with  . Abdominal Pain    (Consider location/radiation/quality/duration/timing/severity/associated sxs/prior treatment) HPI Comments: Patient reports sudden onset of abdominal pain that began as diffuse gassy pain and transitioned quickly into sharp RLQ pain.  Pain does not radiate.  Worse/better depending on position.  Associated dysuria.  Denies fevers, N/V, testicular pain or swelling.  Last BM was yesterday and was normal.  Abdominal surgeries: remote bilateral inguinal hernia repair.    Patient is a 36 y.o. male presenting with abdominal pain. The history is provided by the patient.  Abdominal Pain The primary symptoms of the illness include abdominal pain and dysuria. The primary symptoms of the illness do not include fever, fatigue, shortness of breath, nausea, vomiting or diarrhea. The current episode started 3 to 5 hours ago.  The dysuria is not associated with frequency or urgency.  Symptoms associated with the illness do not include urgency or frequency.    Past Medical History  Diagnosis Date  . Anxiety   . Panic attacks     Past Surgical History  Procedure Date  . Hernia repair     No family history on file.  History  Substance Use Topics  . Smoking status: Never Smoker   . Smokeless tobacco: Not on file  . Alcohol Use: No      Review of Systems  Constitutional: Negative for fever and fatigue.  Respiratory: Negative for shortness of breath.   Gastrointestinal: Positive for abdominal pain. Negative for nausea, vomiting and diarrhea.  Genitourinary: Positive for dysuria. Negative for urgency, frequency, scrotal swelling and testicular pain.    Allergies  Review of patient's allergies indicates no known allergies.  Home Medications   Current Outpatient Rx  Name Route Sig Dispense Refill  .  BUPROPION HCL ER (SR) 150 MG PO TB12 Oral Take 150 mg by mouth 2 (two) times daily.     Marland Kitchen CLONAZEPAM 1 MG PO TABS Oral Take 1 mg by mouth 2 (two) times daily.     Marland Kitchen HYDROCODONE-ACETAMINOPHEN 10-325 MG PO TABS Oral Take 2 tablets by mouth every 6 (six) hours as needed. For pain    . THERA M PLUS PO TABS Oral Take 1 tablet by mouth daily. Complete Man     . QUETIAPINE FUMARATE 50 MG PO TABS Oral Take 50 mg by mouth at bedtime.      Marland Kitchen PROMETHAZINE HCL 25 MG RE SUPP Rectal Place 1 suppository (25 mg total) rectally every 6 (six) hours as needed for nausea. 12 each 0  . PROMETHAZINE HCL 25 MG PO TABS Oral Take 1 tablet (25 mg total) by mouth every 6 (six) hours as needed for nausea. 30 tablet 0    BP 113/75  Pulse 109  Temp 97.7 F (36.5 C) (Oral)  Resp 18  SpO2 100%  Physical Exam  Nursing note and vitals reviewed. Constitutional: He is oriented to person, place, and time. He appears well-developed and well-nourished. No distress.  HENT:  Head: Normocephalic and atraumatic.  Neck: Neck supple.  Cardiovascular: Normal rate, regular rhythm and normal heart sounds.   Pulmonary/Chest: Breath sounds normal. No respiratory distress. He has no wheezes. He has no rales. He exhibits no tenderness.  Abdominal: Soft. Bowel sounds are normal. He exhibits no distension and no mass. There is generalized tenderness. There is no  rebound, no guarding and no CVA tenderness.       Generalized abdominal tenderness, worse on right.    Neurological: He is alert and oriented to person, place, and time.  Skin: He is not diaphoretic.    ED Course  Procedures (including critical care time)  Labs Reviewed  BASIC METABOLIC PANEL - Abnormal; Notable for the following:    GFR calc non Af Amer 80 (*)     All other components within normal limits  URINALYSIS, ROUTINE W REFLEX MICROSCOPIC  CBC WITH DIFFERENTIAL   Ct Abdomen Pelvis Wo Contrast  08/09/2011  *RADIOLOGY REPORT*  Clinical Data: Right flank pain  CT  ABDOMEN AND PELVIS WITHOUT CONTRAST  Technique:  Multidetector CT imaging of the abdomen and pelvis was performed following the standard protocol without intravenous contrast.  Comparison: 12/28/2010  Findings: No hydronephrosis.  No urinary calculus.  The cecum is severely distended and filled with stool.  Extends into the left upper quadrant.  No disproportionate dilatation of small bowel.  No obvious mass in the ascending colon.  Normal appendix.  Bladder and prostate are unremarkable.  Unenhanced liver, gallbladder, spleen, pancreas, adrenal glands are within normal limits.  No destructive bone lesion.  No free fluid.  Grade 1 L5 S1 spondylolisthesis.  IMPRESSION: No evidence of urinary calculus or urinary obstruction.  Marked distention of the cecum which is filled with stool.  Original Report Authenticated By: Donavan Burnet, M.D.   Reexamination of abdomen prior to discharge shows continued diffuse tenderness, worse on right, no guarding, no rebound.    1. Constipation       MDM  Pt with abdominal pain x several hours, initially diffuse and gassy then localized to right side.  Pt with associated dysuria.  Pt does not have UTI and no renal stones.  CT shows significant amount of stool distending the cecum.  Labs unremarkable.  Pt reported improvement with pain medication, abdominal exam remained benign.  Discussed results and return precautions with patient.  Pt tolerating PO, no nausea or vomiting, BM today.   Doubt SBO.  Discussed constipation with patient - pt does have occasional hard stools and difficulty passing stools.  Will attempt stool softeners and laxatives at home, have given some pain medication as well given amount of distension.  PCP follow up - pt has PCP Dr Elesa Massed in [place] he still follows will, requests local resources which I have given.  Pt does have health insurance.  Discussed strict return precautions.  Patient verbalizes understanding and agrees with plan.           Dillard Cannon [place], [place] 08/09/11 2047

## 2011-08-11 NOTE — ED Provider Notes (Signed)
Medical screening examination/treatment/procedure(s) were performed by non-physician practitioner and as supervising physician I was immediately available for consultation/collaboration.    Glenyce Randle R Elye Harmsen, MD 08/11/11 1114 

## 2011-10-21 ENCOUNTER — Emergency Department (HOSPITAL_COMMUNITY)
Admission: EM | Admit: 2011-10-21 | Discharge: 2011-10-21 | Disposition: A | Discharge status: Home / Self Care | Payer: No Typology Code available for payment source | Attending: Emergency Medicine | Admitting: Emergency Medicine

## 2011-10-21 ENCOUNTER — Emergency Department (HOSPITAL_COMMUNITY): Payer: No Typology Code available for payment source

## 2011-10-21 ENCOUNTER — Encounter (HOSPITAL_COMMUNITY): Payer: Self-pay | Admitting: Emergency Medicine

## 2011-10-21 DIAGNOSIS — F411 Generalized anxiety disorder: Secondary | ICD-10-CM | POA: Insufficient documentation

## 2011-10-21 DIAGNOSIS — T148XXA Other injury of unspecified body region, initial encounter: Secondary | ICD-10-CM | POA: Insufficient documentation

## 2011-10-21 DIAGNOSIS — X58XXXA Exposure to other specified factors, initial encounter: Secondary | ICD-10-CM | POA: Insufficient documentation

## 2011-10-21 MED ORDER — HYDROMORPHONE HCL PF 1 MG/ML IJ SOLN
1.0000 mg | Freq: Once | INTRAMUSCULAR | Status: AC
  Administered 2011-10-21: 1 mg via INTRAMUSCULAR
  Filled 2011-10-21: qty 1

## 2011-10-21 MED ORDER — HYDROMORPHONE HCL 4 MG PO TABS: ORAL_TABLET | ORAL | Status: DC

## 2011-10-21 NOTE — ED Notes (Signed)
Pt. Has ride home 

## 2011-10-21 NOTE — ED Notes (Signed)
Pt c/o right shoulder pain with some numbness x 4 days and HA starting today

## 2011-10-21 NOTE — ED Provider Notes (Signed)
History     CSN: [id number]  Arrival date & time 10/21/11  5638   First MD Initiated Contact with Patient 10/21/11 2243      Chief Complaint  Patient presents with  . Arm Pain  . Headache    (Consider location/radiation/quality/duration/timing/severity/associated sxs/prior treatment) Patient is a 36 y.o. male presenting with arm pain. The history is provided by the patient.  Arm Pain   36 year old, male, swims for exercise, complains of pain in his scapular area.  He denies direct, trauma.  This pain has been present for several days.  The pain increases, if he moves his arm.  Past Medical History  Diagnosis Date  . Anxiety   . Panic attacks     Past Surgical History  Procedure Date  . Hernia repair     History reviewed. No pertinent family history.  History  Substance Use Topics  . Smoking status: Never Smoker   . Smokeless tobacco: Not on file  . Alcohol Use: Yes     occ      Review of Systems  Musculoskeletal:       Right sided arm/back pain around scapula  Neurological: Negative for weakness.  Hematological: Does not bruise/bleed easily.    Allergies  Review of patient's allergies indicates no known allergies.  Home Medications   Current Outpatient Rx  Name Route Sig Dispense Refill  . BUPROPION HCL ER (SR) 150 MG PO TB12 Oral Take 150 mg by mouth 2 (two) times daily.     Marland Kitchen CLONAZEPAM 1 MG PO TABS Oral Take 1 mg by mouth 2 (two) times daily.     Marland Kitchen HYDROCODONE-ACETAMINOPHEN 10-325 MG PO TABS Oral Take 2 tablets by mouth every 6 (six) hours as needed. For pain    . IBUPROFEN 200 MG PO TABS Oral Take 800 mg by mouth every 6 (six) hours as needed. For pain    . THERA M PLUS PO TABS Oral Take 1 tablet by mouth daily. Complete Man     . QUETIAPINE FUMARATE 50 MG PO TABS Oral Take 50 mg by mouth at bedtime.        BP 114/75  Pulse 89  Temp 98 F (36.7 C) (Oral)  Resp 18  SpO2 100%  Physical Exam  Nursing note and vitals reviewed. Constitutional:  He appears well-developed and well-nourished.  HENT:  Head: Normocephalic and atraumatic.  Eyes: Conjunctivae normal are normal.  Neck: Normal range of motion. Neck supple.  Pulmonary/Chest: Effort normal.  Abdominal: He exhibits no distension.  Musculoskeletal: Normal range of motion. He exhibits tenderness. He exhibits no edema.       Right upper extermity/back No deformity No swelling or bruising + ttp over scapula and medially toward t-spine.   Passive rom does  Not cause pain but active rom causes pain.    ED Course  Procedures (including critical care time)  Labs Reviewed - No data to display Dg Shoulder Right  10/21/2011  *RADIOLOGY REPORT*  Clinical Data: Right shoulder pain progressively worsening.  No injury.  RIGHT SHOULDER - 2+ VIEW  Comparison: None.  Findings: No fracture or dislocation. No significant shoulder degenerative changes.  Small bone island glenoid.  Minimal degenerative changes lower thoracic spine.  Visualized lungs clear.  IMPRESSION: No acute abnormality.   Original Report Authenticated By: Fuller Canada, M.D.      No diagnosis found.    MDM   Muscle strain or tear        Cheri Guppy, MD 10/21/11  2317 

## 2011-10-28 ENCOUNTER — Encounter (HOSPITAL_COMMUNITY): Payer: Self-pay

## 2011-10-28 ENCOUNTER — Emergency Department (HOSPITAL_COMMUNITY)
Admission: EM | Admit: 2011-10-28 | Discharge: 2011-10-28 | Disposition: A | Discharge status: Home / Self Care | Payer: No Typology Code available for payment source | Attending: Emergency Medicine | Admitting: Emergency Medicine

## 2011-10-28 DIAGNOSIS — M549 Dorsalgia, unspecified: Secondary | ICD-10-CM | POA: Insufficient documentation

## 2011-10-28 DIAGNOSIS — M898X1 Other specified disorders of bone, shoulder: Secondary | ICD-10-CM

## 2011-10-28 DIAGNOSIS — R209 Unspecified disturbances of skin sensation: Secondary | ICD-10-CM | POA: Insufficient documentation

## 2011-10-28 DIAGNOSIS — F410 Panic disorder [episodic paroxysmal anxiety] without agoraphobia: Secondary | ICD-10-CM | POA: Insufficient documentation

## 2011-10-28 DIAGNOSIS — M25519 Pain in unspecified shoulder: Secondary | ICD-10-CM | POA: Insufficient documentation

## 2011-10-28 DIAGNOSIS — G8929 Other chronic pain: Secondary | ICD-10-CM | POA: Insufficient documentation

## 2011-10-28 MED ORDER — PREDNISONE 20 MG PO TABS: ORAL_TABLET | ORAL | Status: DC

## 2011-10-28 MED ORDER — TRAMADOL-ACETAMINOPHEN 37.5-325 MG PO TABS
ORAL_TABLET | ORAL | Status: DC
Start: 2011-10-28 — End: 2011-10-28

## 2011-10-28 MED ORDER — TRAMADOL-ACETAMINOPHEN 37.5-325 MG PO TABS: ORAL_TABLET | ORAL | Status: DC

## 2011-10-28 MED ORDER — FAMOTIDINE 20 MG PO TABS: 20.0000 mg | ORAL_TABLET | Freq: Two times a day (BID) | ORAL | Status: DC

## 2011-10-28 MED ORDER — CYCLOBENZAPRINE HCL 10 MG PO TABS
10.0000 mg | ORAL_TABLET | Freq: Once | ORAL | Status: AC
  Administered 2011-10-28: 10 mg via ORAL
  Filled 2011-10-28: qty 1

## 2011-10-28 MED ORDER — CYCLOBENZAPRINE HCL 10 MG PO TABS: 10.0000 mg | ORAL_TABLET | Freq: Three times a day (TID) | ORAL | Status: DC | PRN

## 2011-10-28 MED ORDER — TRAMADOL HCL 50 MG PO TABS
100.0000 mg | ORAL_TABLET | Freq: Once | ORAL | Status: AC
  Administered 2011-10-28: 100 mg via ORAL
  Filled 2011-10-28: qty 2

## 2011-10-28 MED ORDER — ACETAMINOPHEN 325 MG PO TABS
650.0000 mg | ORAL_TABLET | Freq: Once | ORAL | Status: AC
  Administered 2011-10-28: 650 mg via ORAL
  Filled 2011-10-28: qty 2

## 2011-10-28 MED ORDER — TRAMADOL HCL 50 MG PO TABS: 100.0000 mg | ORAL_TABLET | Freq: Four times a day (QID) | ORAL | Status: DC | PRN

## 2011-10-28 MED ORDER — PREDNISONE 20 MG PO TABS
60.0000 mg | ORAL_TABLET | Freq: Once | ORAL | Status: AC
  Administered 2011-10-28: 60 mg via ORAL
  Filled 2011-10-28: qty 3

## 2011-10-28 NOTE — ED Notes (Signed)
  ptcomplains of right arm pain, hx of recent pulled muscle in same arm and the pain is not able to get under control. Was seen last Saturday

## 2011-10-28 NOTE — ED Provider Notes (Signed)
History  This chart was scribed for Ward Givens, MD by Bennett Scrape. This patient was seen in room TR11C/TR11C and the patient's care was started at 4:45PM.  CSN: [id number]  Arrival date & time 10/28/11  1405   None     Chief Complaint  Patient presents with  . Arm Pain     The history is provided by the patient. No language interpreter was used.    Juan Robinson is a 36 y.o. male who presents to the Emergency Department complaining of 7 days of sudden onset, non-changing, constant right shoulder pain that radiates down the outer side of the right arm described as burning with associated right hand tingling that started while he was swimming at the [facility]. He denies hearing a pop or crack and states that the pain is worse with movement of the right shoulder. He reports that he was seen in this ED 7 days ago (10/21/11), had a negative x-ray of the right shoulder and was told to follow up with his PCP for a possible muscle strain which he has yet to do.He reports his pain isn't worse it is still there and he has run out of the dilaudid he was prescribed. He describes a stocking glove numbness of his right hand from the wrist down, but denies loss of motor strength.  He reports chronic back pain but denies neck pain and arm weakness as associated symptoms. He has a h/o anxiety and panic attacks. He is an occasional alcohol user but denies smoking.  Dr. Christell Constant is PCP but pt states that his PCP recently moved and he needs to find a new PCP. Pt did not report he is seeing Dr. Ethelene Hal for chronic back pain and reports that he gets hydrocodone and soma once a month but states that they don't work.     Past Medical History  Diagnosis Date  . Anxiety   . Panic attacks     Past Surgical History  Procedure Date  . Hernia repair     No family history on file.  History  Substance Use Topics  . Smoking status: Never Smoker   . Smokeless tobacco: Not on file  . Alcohol Use: Yes     occ    Works as a Teacher, music    Review of Systems  HENT: Negative for neck pain and neck stiffness.   Musculoskeletal: Positive for back pain (chronic). Negative for myalgias.       Positive for right arm pain  Neurological: Positive for numbness. Negative for weakness.    Allergies  Review of patient's allergies indicates no known allergies.  Home Medications   Current Outpatient Rx  Name Route Sig Dispense Refill  . BUPROPION HCL ER (SR) 150 MG PO TB12 Oral Take 150 mg by mouth 2 (two) times daily.     Marland Kitchen CLONAZEPAM 1 MG PO TABS Oral Take 1 mg by mouth 2 (two) times daily.     Marland Kitchen HYDROCODONE-ACETAMINOPHEN 10-325 MG PO TABS Oral Take 2 tablets by mouth every 6 (six) hours as needed. For pain    . HYDROMORPHONE HCL 4 MG PO TABS  Take 1 by mouth every 8 hours when necessary pain 20 tablet 0  . IBUPROFEN 200 MG PO TABS Oral Take 800 mg by mouth every 6 (six) hours as needed. For pain    . THERA M PLUS PO TABS Oral Take 1 tablet by mouth daily. Complete Man     . QUETIAPINE FUMARATE 50 MG  PO TABS Oral Take 50 mg by mouth at bedtime.        Triage Vitals: BP 113/71  Pulse 83  Temp 98.1 F (36.7 C) (Oral)  Resp 16  SpO2 98%  Vital signs normal    Physical Exam  Nursing note and vitals reviewed. Constitutional: He is oriented to person, place, and time. He appears well-developed and well-nourished. No distress.  HENT:  Head: Normocephalic and atraumatic.  Eyes: EOM are normal.  Neck: Neck supple. No tracheal deviation present.  Cardiovascular: Normal rate.   Pulmonary/Chest: Effort normal. No respiratory distress.  Musculoskeletal: Normal range of motion. He exhibits tenderness.       Tender in the medial  lower scapula on the right, moderately tender to the tip of the scapula, pain along the proximal lateral aspect of the scapula with abduction of the right arm, radial, ulnar and medial nerves are intact No swelling, redness seen.  nontender thoracic spine midline.    Neurological: He is alert and oriented to person, place, and time.  Skin: Skin is warm and dry.  Psychiatric: He has a normal mood and affect. His behavior is normal.    ED Course  Procedures (including critical care time)   Medications  cyclobenzaprine (FLEXERIL) tablet 10 mg (not administered)  predniSONE (DELTASONE) tablet 60 mg (not administered)  traMADol-acetaminophen (ULTRACET) 37.5-325 MG per tablet (not administered)  traMADol (ULTRAM) 50 MG tablet (not administered)   Review of the [place] controlled substance site shows patient gets #90 hydrocodone 10/325 monthly, the last was September 11. These were prescribed by Dr. Ethelene Hal. Patient also gets soma 350 mg tablets #45 monthly the last time was 9/4 prescribed by Dr. Elesa Massed. He also gets #30 amphetamine salts 30 mg tablets prescribed monthly the last was September 3 prescribed by Dr. Jeannine Kitten, he also gets #60 Klonopin 1 mg tablets prescribed monthly the last was September 10 by Dr. Elesa Massed  DIAGNOSTIC STUDIES: Oxygen Saturation is 98% on room air, normal by my interpretation.    COORDINATION OF CARE: 5:07PM-Discussed treatment plan which includes a muscle relaxer, antiinflammatory and referral to an orthopedist with pt at bedside and pt agreed to plan.    1. Pain of right scapula    New Prescriptions   CYCLOBENZAPRINE (FLEXERIL) 10 MG TABLET    Take 1 tablet (10 mg total) by mouth 3 (three) times daily as needed for muscle spasms.   FAMOTIDINE (PEPCID) 20 MG TABLET    Take 1 tablet (20 mg total) by mouth 2 (two) times daily.   PREDNISONE (DELTASONE) 20 MG TABLET    Take 3 po QD x 3d , then 2 po QD x 3d then 1 po QD x 3d   TRAMADOL (ULTRAM) 50 MG TABLET    Take 2 tablets (100 mg total) by mouth every 6 (six) hours as needed for pain.   Plan discharge  Devoria Albe, MD, FACEP    MDM   I personally performed the services described in this documentation, which was scribed in my presence. The recorded information has been  reviewed and considered.  Devoria Albe, MD, Armando Gang    Ward Givens, MD 10/28/11 1745

## 2014-12-26 ENCOUNTER — Encounter (HOSPITAL_COMMUNITY): Payer: Self-pay | Admitting: *Deleted

## 2014-12-26 ENCOUNTER — Emergency Department (HOSPITAL_COMMUNITY)
Admission: EM | Admit: 2014-12-26 | Discharge: 2014-12-26 | Disposition: A | Discharge status: Other Acute Inpatient Hospital | Payer: No Typology Code available for payment source | Attending: Emergency Medicine | Admitting: Emergency Medicine

## 2014-12-26 ENCOUNTER — Encounter (HOSPITAL_COMMUNITY): Payer: Self-pay | Admitting: Emergency Medicine

## 2014-12-26 ENCOUNTER — Observation Stay (HOSPITAL_COMMUNITY)
Admission: AD | Admit: 2014-12-26 | Discharge: 2014-12-28 | Disposition: A | Discharge status: Home / Self Care | Payer: No Typology Code available for payment source | Source: Intra-hospital | Attending: Psychiatry | Admitting: Psychiatry

## 2014-12-26 DIAGNOSIS — F1110 Opioid abuse, uncomplicated: Secondary | ICD-10-CM | POA: Insufficient documentation

## 2014-12-26 DIAGNOSIS — F1914 Other psychoactive substance abuse with psychoactive substance-induced mood disorder: Secondary | ICD-10-CM | POA: Diagnosis present

## 2014-12-26 DIAGNOSIS — F410 Panic disorder [episodic paroxysmal anxiety] without agoraphobia: Secondary | ICD-10-CM | POA: Insufficient documentation

## 2014-12-26 DIAGNOSIS — F1020 Alcohol dependence, uncomplicated: Secondary | ICD-10-CM

## 2014-12-26 DIAGNOSIS — F1010 Alcohol abuse, uncomplicated: Secondary | ICD-10-CM | POA: Diagnosis present

## 2014-12-26 DIAGNOSIS — F1410 Cocaine abuse, uncomplicated: Secondary | ICD-10-CM | POA: Diagnosis not present

## 2014-12-26 DIAGNOSIS — F329 Major depressive disorder, single episode, unspecified: Secondary | ICD-10-CM | POA: Insufficient documentation

## 2014-12-26 DIAGNOSIS — F419 Anxiety disorder, unspecified: Secondary | ICD-10-CM | POA: Insufficient documentation

## 2014-12-26 DIAGNOSIS — Z79899 Other long term (current) drug therapy: Secondary | ICD-10-CM | POA: Diagnosis not present

## 2014-12-26 DIAGNOSIS — F1124 Opioid dependence with opioid-induced mood disorder: Principal | ICD-10-CM | POA: Insufficient documentation

## 2014-12-26 DIAGNOSIS — F1310 Sedative, hypnotic or anxiolytic abuse, uncomplicated: Secondary | ICD-10-CM | POA: Diagnosis not present

## 2014-12-26 DIAGNOSIS — F1910 Other psychoactive substance abuse, uncomplicated: Secondary | ICD-10-CM | POA: Diagnosis present

## 2014-12-26 DIAGNOSIS — F32A Depression, unspecified: Secondary | ICD-10-CM

## 2014-12-26 DIAGNOSIS — G4700 Insomnia, unspecified: Secondary | ICD-10-CM | POA: Diagnosis not present

## 2014-12-26 DIAGNOSIS — F1994 Other psychoactive substance use, unspecified with psychoactive substance-induced mood disorder: Secondary | ICD-10-CM

## 2014-12-26 HISTORY — DX: Other psychoactive substance abuse, uncomplicated: F19.10

## 2014-12-26 LAB — CBC WITH DIFFERENTIAL/PLATELET
BASOS ABS: 0 10*3/uL (ref 0.0–0.1)
Basophils Relative: 0 %
EOS ABS: 0.2 10*3/uL (ref 0.0–0.7)
EOS PCT: 2 %
HCT: 37.9 % — ABNORMAL LOW (ref 39.0–52.0)
Hemoglobin: 13.3 g/dL (ref 13.0–17.0)
Lymphocytes Relative: 24 %
Lymphs Abs: 1.8 10*3/uL (ref 0.7–4.0)
MCH: 32 pg (ref 26.0–34.0)
MCHC: 35.1 g/dL (ref 30.0–36.0)
MCV: 91.1 fL (ref 78.0–100.0)
Monocytes Absolute: 0.6 10*3/uL (ref 0.1–1.0)
Monocytes Relative: 8 %
Neutro Abs: 4.9 10*3/uL (ref 1.7–7.7)
Neutrophils Relative %: 66 %
PLATELETS: 201 10*3/uL (ref 150–400)
RBC: 4.16 MIL/uL — AB (ref 4.22–5.81)
RDW: 13 % (ref 11.5–15.5)
WBC: 7.5 10*3/uL (ref 4.0–10.5)

## 2014-12-26 LAB — RAPID URINE DRUG SCREEN, HOSP PERFORMED
AMPHETAMINES: NOT DETECTED
BENZODIAZEPINES: POSITIVE — AB
Barbiturates: POSITIVE — AB
Cocaine: POSITIVE — AB
OPIATES: POSITIVE — AB
Tetrahydrocannabinol: NOT DETECTED

## 2014-12-26 LAB — BASIC METABOLIC PANEL
Anion gap: 7 (ref 5–15)
BUN: 19 mg/dL (ref 6–20)
CALCIUM: 9.4 mg/dL (ref 8.9–10.3)
CO2: 26 mmol/L (ref 22–32)
CREATININE: 1.15 mg/dL (ref 0.61–1.24)
Chloride: 104 mmol/L (ref 101–111)
GFR calc non Af Amer: >60 mL/min (ref >60)
Glucose, Bld: 125 mg/dL — ABNORMAL HIGH (ref 65–99)
Potassium: 4.1 mmol/L (ref 3.5–5.1)
SODIUM: 137 mmol/L (ref 135–145)

## 2014-12-26 LAB — ETHANOL

## 2014-12-26 MED ORDER — ONDANSETRON HCL 4 MG PO TABS: 4.0000 mg | ORAL_TABLET | Freq: Three times a day (TID) | ORAL | Status: DC | PRN

## 2014-12-26 MED ORDER — HYDROXYZINE HCL 25 MG PO TABS: 25.0000 mg | ORAL_TABLET | Freq: Four times a day (QID) | ORAL | Status: DC | PRN

## 2014-12-26 MED ORDER — ADULT MULTIVITAMIN W/MINERALS CH
1.0000 | ORAL_TABLET | Freq: Every day | ORAL | Status: DC
  Administered 2014-12-27 – 2014-12-28 (×2): 1 via ORAL
  Filled 2014-12-26 (×2): qty 1

## 2014-12-26 MED ORDER — ONDANSETRON 4 MG PO TBDP
4.0000 mg | ORAL_TABLET | Freq: Four times a day (QID) | ORAL | Status: DC | PRN
  Administered 2014-12-27: 4 mg via ORAL
  Filled 2014-12-26: qty 1

## 2014-12-26 MED ORDER — LORAZEPAM 1 MG PO TABS
1.0000 mg | ORAL_TABLET | Freq: Three times a day (TID) | ORAL | Status: DC
  Administered 2014-12-28: 1 mg via ORAL
  Filled 2014-12-26: qty 1

## 2014-12-26 MED ORDER — ALUM & MAG HYDROXIDE-SIMETH 200-200-20 MG/5ML PO SUSP: 30.0000 mL | ORAL | Status: DC | PRN

## 2014-12-26 MED ORDER — ACETAMINOPHEN 325 MG PO TABS
650.0000 mg | ORAL_TABLET | Freq: Four times a day (QID) | ORAL | Status: DC | PRN
  Administered 2014-12-27 (×2): 650 mg via ORAL
  Filled 2014-12-26 (×2): qty 2

## 2014-12-26 MED ORDER — ACETAMINOPHEN 325 MG PO TABS: 650.0000 mg | ORAL_TABLET | ORAL | Status: DC | PRN

## 2014-12-26 MED ORDER — LORAZEPAM 1 MG PO TABS: 1.0000 mg | ORAL_TABLET | Freq: Every day | ORAL | Status: DC

## 2014-12-26 MED ORDER — LORAZEPAM 1 MG PO TABS
1.0000 mg | ORAL_TABLET | Freq: Four times a day (QID) | ORAL | Status: AC
  Administered 2014-12-27 – 2014-12-28 (×5): 1 mg via ORAL
  Filled 2014-12-26 (×5): qty 1

## 2014-12-26 MED ORDER — LOPERAMIDE HCL 2 MG PO CAPS: 2.0000 mg | ORAL_CAPSULE | ORAL | Status: DC | PRN

## 2014-12-26 MED ORDER — VITAMIN B-1 100 MG PO TABS
100.0000 mg | ORAL_TABLET | Freq: Every day | ORAL | Status: DC
  Administered 2014-12-27 – 2014-12-28 (×2): 100 mg via ORAL
  Filled 2014-12-26 (×2): qty 1

## 2014-12-26 MED ORDER — QUETIAPINE FUMARATE 50 MG PO TABS
50.0000 mg | ORAL_TABLET | Freq: Every day | ORAL | Status: DC
  Administered 2014-12-27: 50 mg via ORAL
  Filled 2014-12-26: qty 1

## 2014-12-26 MED ORDER — NICOTINE 21 MG/24HR TD PT24: 21.0000 mg | MEDICATED_PATCH | Freq: Every day | TRANSDERMAL | Status: DC | PRN

## 2014-12-26 MED ORDER — GABAPENTIN 600 MG PO TABS
600.0000 mg | ORAL_TABLET | Freq: Two times a day (BID) | ORAL | Status: DC
  Administered 2014-12-27 – 2014-12-28 (×3): 600 mg via ORAL
  Filled 2014-12-26 (×3): qty 1

## 2014-12-26 MED ORDER — IBUPROFEN 200 MG PO TABS: 600.0000 mg | ORAL_TABLET | Freq: Three times a day (TID) | ORAL | Status: DC | PRN

## 2014-12-26 MED ORDER — LORAZEPAM 1 MG PO TABS
1.0000 mg | ORAL_TABLET | Freq: Four times a day (QID) | ORAL | Status: DC | PRN
  Administered 2014-12-27: 1 mg via ORAL
  Filled 2014-12-26: qty 1

## 2014-12-26 MED ORDER — BUPROPION HCL ER (SR) 100 MG PO TB12
100.0000 mg | ORAL_TABLET | Freq: Every day | ORAL | Status: DC
  Administered 2014-12-27 – 2014-12-28 (×2): 100 mg via ORAL
  Filled 2014-12-26 (×2): qty 1

## 2014-12-26 MED ORDER — LORAZEPAM 1 MG PO TABS: 1.0000 mg | ORAL_TABLET | Freq: Two times a day (BID) | ORAL | Status: DC

## 2014-12-26 MED ORDER — ZOLPIDEM TARTRATE 5 MG PO TABS: 5.0000 mg | ORAL_TABLET | Freq: Every evening | ORAL | Status: DC | PRN

## 2014-12-26 NOTE — [facility] Counselor (Signed)
This Clinical research associatewriter spoke briefly to patient about possible placement.  Pt initially told writer he didn't know what he wanted, whether detox or rehab and then stated if he went to rehab he would lose everything he has, he was not specific with details.  Pt is currently employed as an auctioneer(self-employed) and states that he has to work on Tuesday 12/29/14.  Pt is now requesting detox, this writer will send referrals for possible and final disposition will be completed by psychiatrist/NP/PA on 12/27/14. Pt says his last use of any substance was 12/26/14 in the morning.

## 2014-12-26 NOTE — ED Notes (Signed)
Entered room to triage patient, patient talking on phone. Will attempt again when patient has finished his phone call.

## 2014-12-26 NOTE — Plan of Care (Signed)
[facility]  Reason for Crisis Plan:  Crisis Stabilization, Substance abuse   Plan of Care:  Referral for Substance Abuse  Family Support:    "no one"  Current Living Environment:  Living Arrangements: Alone  Insurance:   Hospital Account    Name Acct ID Class Status Primary Coverage   Juan Robinson, Juan Robinson [id number] BEHAVIORAL HEALTH OBSERVATION Open [employer] [place]        Guarantor Account (for Hospital Account [id number])    Name Relation to Pt Service Area Active? Acct Type   Juan Robinson, Juan Robinson [facility] Yes [facility]al [facility]   Address Phone       [address] [place], [place] [id number] [phone])          Coverage Information (for Hospital Account [id number])    F/O Payor/Plan Precert #   [facility]    Subscriber Subscriber #   Juan Robinson, Juan Robinson [id number]   Address Phone   [address] [place], [place] [phone]      Legal Guardian:     Primary Care Provider:  No primary care provider on file.  Current Outpatient Providers:  none  Psychiatrist:     Counselor/Therapist:     Compliant with Medications:  Yes  Additional Information:   Juan Robinson, Juan Robinson 11/26/201610:47 PM

## 2014-12-26 NOTE — ED Notes (Signed)
Pt. offered toilet assistance to obtain urine sample. Pt walked to and from bathroom with assistance, unable to give urine sample. Pt provided urinal at bedside. Lt/rt bedrails placed in upward position, pt on cardiac monitor, pt provided sandwich/drink at bedside, comfort measures given, RN advised. ENM

## 2014-12-26 NOTE — ED Notes (Signed)
[facility], receiving RN unavailable

## 2014-12-26 NOTE — ED Notes (Signed)
Patient requesting detox from drugs (heroin, crack, cocaine, pills, benzos). Patient also reports he drinks alcohol but does not need detox for that. Patient last drug use about 1 hour. Patient says he has gone through drug detox problems in the past, 2011 when asked.

## 2014-12-26 NOTE — ED Notes (Signed)
Patient drowsy, placed on pulse ox to monitor SPO2 and HR.

## 2014-12-26 NOTE — ED Notes (Signed)
MD at bedside. 

## 2014-12-26 NOTE — ED Notes (Signed)
Attempted to wake pt and assess his loc [facility]. Pt responds to tactile stimuli . Attempted to ambulate pt, he was able to stand up yet ambulated with unsteady gait and kept falling asleep while walking. Assisted pt back to bed with two staff assistance. [facility] and ER provider made aware. decision was made to hold on transfer until pt is alert and oriented.
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
## 2014-12-26 NOTE — Progress Notes (Signed)
[facility] INPATIENT:  Family/Significant Other Suicide Prevention Education  Suicide Prevention Education:  Patient Refusal for Family/Significant Other Suicide Prevention Education: The patient Juan Robinson has refused to provide written consent for family/significant other to be provided Family/Significant Other Suicide Prevention Education during admission and/or prior to discharge.    Celene KrasRobinson, Aireal Slater G 12/26/2014, 10:57 PM

## 2014-12-26 NOTE — ED Notes (Signed)
Seizure pads placed on pt's bed d/t increased risk of seizures.

## 2014-12-26 NOTE — ED Provider Notes (Signed)
CSN: [id number]     Arrival date & time 12/26/14  0845 History   First MD Initiated Contact with Patient 12/26/14 0919     Chief Complaint  Patient presents with  . drug detox     polysubstance     (Consider location/radiation/quality/duration/timing/severity/associated sxs/prior Treatment) HPI   Drenda Freezereston Heatley is a 39 y.o. male who presents for evaluation of polysubstance abuse, and desire for "detox". He states he drove his vehicle to the hospital today, used heroin, then came into the ED, for treatment. He states he gets medications for back pain, and neck pain, [place], and admits that he is "using it inappropriately." States last detox was several years ago. He uses alcohol occasionally. He states that he [place] in [place]. He denies recent fever, chills, cough, chest pain, weakness or dizziness. There are no other no modifying factors.   Past Medical History  Diagnosis Date  . Anxiety   . Panic attacks   . Polysubstance abuse    Past Surgical History  Procedure Laterality Date  . Hernia repair     History reviewed. No pertinent family history. Social History  Substance Use Topics  . Smoking status: Never Smoker   . Smokeless tobacco: None  . Alcohol Use: Yes     Comment: drinks 4 days a week, "whatever i can get"    Review of Systems  All other systems reviewed and are negative.     Allergies  Review of patient's allergies indicates no known allergies.  Home Medications   Prior to Admission medications   Medication Sig Start Date End Date Taking? Authorizing Provider  amphetamine-dextroamphetamine (ADDERALL) 30 MG tablet Take 30 mg by mouth every morning.  06/03/14  Yes Historical Provider, MD  buPROPion (WELLBUTRIN SR) 100 MG 12 hr tablet take 1 tablet by mouth once daily 02/13/14  Yes Historical Provider, MD  diazepam (VALIUM) 10 MG tablet Take 1 tablet by mouth once daily 02/13/14  Yes Historical Provider,  MD  gabapentin (NEURONTIN) 600 MG tablet Take 600 mg by mouth 2 (two) times daily.  05/18/14  Yes Historical Provider, MD  HYDROcodone-acetaminophen (NORCO) 10-325 MG per tablet Take 2 tablets by mouth every 6 (six) hours as needed. For pain   Yes Historical Provider, MD  HYDROmorphone (DILAUDID) 4 MG tablet Take 1 by mouth every 8 hours when necessary pain 10/21/11  Yes Cheri GuppyJeffrey Caporossi, MD  QUEtiapine (SEROQUEL) 50 MG tablet Take 50 mg by mouth at bedtime.     Yes Historical Provider, MD  traMADol (ULTRAM) 50 MG tablet Take 2 tablets (100 mg total) by mouth every 6 (six) hours as needed for pain. 10/28/11  Yes Devoria AlbeIva Knapp, MD  cyclobenzaprine (FLEXERIL) 10 MG tablet Take 1 tablet (10 mg total) by mouth 3 (three) times daily as needed for muscle spasms. Patient not taking: Reported on 12/26/2014 10/28/11   Devoria AlbeIva Knapp, MD  famotidine (PEPCID) 20 MG tablet Take 1 tablet (20 mg total) by mouth 2 (two) times daily. Patient not taking: Reported on 12/26/2014 10/28/11   Devoria AlbeIva Knapp, MD  predniSONE (DELTASONE) 20 MG tablet Take 3 po QD x 3d , then 2 po QD x 3d then 1 po QD x 3d Patient not taking: Reported on 12/26/2014 10/28/11   Devoria AlbeIva Knapp, MD   BP 120/83 mmHg  Pulse 96  Temp(Src) 97.6 F (36.4 C) (Oral)  Resp 14  Ht 5\' 11"  (1.803 m)  Wt 158 lb (71.668 kg)  BMI  22.05 kg/m2  SpO2 98% Physical Exam  Constitutional: He is oriented to person, place, and time. He appears well-developed and well-nourished.  HENT:  Head: Normocephalic and atraumatic.  Right Ear: External ear normal.  Left Ear: External ear normal.  Eyes: Conjunctivae and EOM are normal. Pupils are equal, round, and reactive to light.  Myotic, reactive pupils, bilaterally symmetric.  Neck: Normal range of motion and phonation normal. Neck supple.  Cardiovascular: Normal rate, regular rhythm and normal heart sounds.   Pulmonary/Chest: Effort normal and breath sounds normal. He exhibits no bony tenderness.  Abdominal: Soft. There is no  tenderness.  Musculoskeletal: Normal range of motion.  Neurological: He is alert and oriented to person, place, and time. No cranial nerve deficit or sensory deficit. He exhibits normal muscle tone. Coordination normal.  Dysarthria consistent with acute intoxication.  Skin: Skin is warm, dry and intact.  Psychiatric: He has a normal mood and affect. His behavior is normal. Judgment and thought content normal.  Nursing note and vitals reviewed.   ED Course  Procedures (including critical care time)  Medications - No data to display  Patient Vitals for the past 24 hrs:  BP Temp Temp src Pulse Resp SpO2 Height Weight  12/26/14 0855 120/83 mmHg 97.6 F (36.4 C) Oral 96 14 98 %  (1.803 m) 158 lb (71.668 kg)    Query from [place] Controlled Substance Reporting System data bank indicates chronic prescriptions and usage of both narcotics and benzodiazepines.  09:55-- TTS consultation- wife felt that he needed to be hospitalized, and transferred him to the behavioral health observation unit.  12:28 PM Reevaluation with update and discussion. After initial assessment and treatment, an updated evaluation reveals no change in clinical status. Brylen Wagar L       Labs Review Labs Reviewed - No data to display  Imaging Review No results found. I have personally reviewed and evaluated these images and lab results as part of my medical decision-making.   EKG Interpretation None      MDM   Final diagnoses:  Depressive disorder  Alcohol use disorder, moderate, dependence (HCC)    Polysubstance abuse, and abuse of prescribed narcotics and benzodiazepines. Patient is at risk for withdrawal seizures, therefore will require inpatient supervised detoxification.  Nursing Notes Reviewed/ Care Coordinated Applicable Imaging Reviewed Interpretation of Laboratory Data incorporated into ED treatment  Plan- As per TTS, in conjunction with oncoming provider team    Mancel Bale, MD 12/26/14 1228

## 2014-12-27 DIAGNOSIS — F1124 Opioid dependence with opioid-induced mood disorder: Secondary | ICD-10-CM | POA: Diagnosis not present

## 2014-12-27 DIAGNOSIS — F1994 Other psychoactive substance use, unspecified with psychoactive substance-induced mood disorder: Secondary | ICD-10-CM | POA: Diagnosis not present

## 2014-12-27 MED ORDER — DICYCLOMINE HCL 20 MG PO TABS
20.0000 mg | ORAL_TABLET | Freq: Four times a day (QID) | ORAL | Status: DC | PRN
  Administered 2014-12-27 – 2014-12-28 (×2): 20 mg via ORAL
  Filled 2014-12-27 (×2): qty 1

## 2014-12-27 MED ORDER — METHOCARBAMOL 500 MG PO TABS
500.0000 mg | ORAL_TABLET | Freq: Three times a day (TID) | ORAL | Status: DC | PRN
  Administered 2014-12-27 – 2014-12-28 (×2): 500 mg via ORAL
  Filled 2014-12-27 (×2): qty 1

## 2014-12-27 MED ORDER — CLONIDINE HCL 0.1 MG PO TABS: 0.1000 mg | ORAL_TABLET | Freq: Every day | ORAL | Status: DC

## 2014-12-27 MED ORDER — CLONIDINE HCL 0.1 MG PO TABS: 0.1000 mg | ORAL_TABLET | ORAL | Status: DC

## 2014-12-27 MED ORDER — CLONIDINE HCL 0.1 MG PO TABS
0.1000 mg | ORAL_TABLET | Freq: Four times a day (QID) | ORAL | Status: DC
  Administered 2014-12-27 – 2014-12-28 (×5): 0.1 mg via ORAL
  Filled 2014-12-27 (×5): qty 1

## 2014-12-27 MED ORDER — NAPROXEN 500 MG PO TABS: 500.0000 mg | ORAL_TABLET | Freq: Two times a day (BID) | ORAL | Status: DC | PRN

## 2014-12-27 NOTE — [facility] Counselor (Signed)
This Clinical research associatewriter [facility]ed ref[facility], [address] [facility] [facility], [place], [facility], [facility] [place]

## 2014-12-27 NOTE — Progress Notes (Signed)
Patient presents to Observation Unit requesting help with substance abuse and depression. Pt admits to using marijuana, cocaine, heroin, and alcohol daily "whatever I can get".  Reports multiple stressors to include fiance moving out about three weeks ago, chronic back/neck pain, legal issues (child support and being questioned for criminal activity.  Denies SI/HI/A/V/H. Emotional support and encouragement given. Pt admitted for evaluation, stabilization and reduction of baseline.
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
## 2014-12-27 NOTE — Clinical Social Work Note (Signed)
CSW met with pt individually to discuss aftercare options. Pt is unsure if he will return to work on Tuesday, unsure if he wants inpatient or outpatient treatment. CSW reviewed information provided to pt ([place], med management/counseling) and provided pt with [facility] information packet. This is an inpatient option that accepts his insurance and will required little to no down payment if he is accepted. Pt encouraged to review provided information and talk about options more with counselor in the morning.  Maxie Better, MSW, LCSW Clinical Social Worker 12/27/2014 6:34 PM

## 2014-12-28 DIAGNOSIS — F1994 Other psychoactive substance use, unspecified with psychoactive substance-induced mood disorder: Secondary | ICD-10-CM | POA: Diagnosis not present

## 2014-12-28 DIAGNOSIS — F1124 Opioid dependence with opioid-induced mood disorder: Secondary | ICD-10-CM | POA: Diagnosis not present

## 2014-12-28 MED ORDER — DICYCLOMINE HCL 20 MG PO TABS: 20.0000 mg | ORAL_TABLET | Freq: Four times a day (QID) | ORAL | Status: DC | PRN

## 2014-12-28 MED ORDER — QUETIAPINE FUMARATE 50 MG PO TABS
50.0000 mg | ORAL_TABLET | Freq: Every day | ORAL | Status: DC
Start: 2014-12-28 — End: 2015-11-30

## 2014-12-28 MED ORDER — ADULT MULTIVITAMIN W/MINERALS CH: 1.0000 | ORAL_TABLET | Freq: Every day | ORAL | Status: AC

## 2014-12-28 MED ORDER — CLONIDINE HCL 0.1 MG PO TABS: ORAL_TABLET | ORAL | Status: DC

## 2014-12-28 MED ORDER — ONDANSETRON 4 MG PO TBDP: 4.0000 mg | ORAL_TABLET | Freq: Four times a day (QID) | ORAL | Status: DC | PRN

## 2014-12-28 MED ORDER — GABAPENTIN 600 MG PO TABS: 600.0000 mg | ORAL_TABLET | Freq: Two times a day (BID) | ORAL | Status: DC

## 2014-12-28 MED ORDER — BUPROPION HCL ER (SR) 100 MG PO TB12: 100.0000 mg | ORAL_TABLET | Freq: Every day | ORAL | Status: DC

## 2014-12-28 MED ORDER — THIAMINE HCL 100 MG PO TABS: 100.0000 mg | ORAL_TABLET | Freq: Every day | ORAL | Status: DC

## 2014-12-28 MED ORDER — METHOCARBAMOL 500 MG PO TABS: 500.0000 mg | ORAL_TABLET | Freq: Three times a day (TID) | ORAL | Status: DC | PRN

## 2014-12-28 NOTE — Progress Notes (Signed)
Patient ID: Juan Robinson, male   DOB: 08/27/1975, 39 y.o.   MRN: [id number] Groaning and restless in bed when writer approached him to assess needs. States he is having withdrawal sx from opiates. Complains of generalized body aches, restlessness, stomach cramps and sweating. Meds given to him for sx resolution. COWS at 12 this am. Waiting on the NP this am for assess and disposition.

## 2014-12-28 NOTE — Discharge Instructions (Signed)
Patient was given information on [facility] for the Substance Abuse Program and possible Methadone management. [facility] programs available.
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
## 2014-12-28 NOTE — Progress Notes (Signed)
Patient is A&Ox4, verbalized understanding of discharge instructions and follow up, no further questions at this time. Patient is being discharged home. Scripts given to patient, all belongings returned to patient.  Fahad Cisse, Wyman SongsterAngela Marie, RN

## 2014-12-28 NOTE — BH Assessment (Signed)
[facility] Assessment Progress Note  Patient was given information on [facility] for the Substance Abuse Program and possible Methadone management. Patient [facility] also [facility] to discuss programs available.

## 2015-11-23 ENCOUNTER — Emergency Department (HOSPITAL_COMMUNITY)
Admission: EM | Admit: 2015-11-23 | Discharge: 2015-11-23 | Disposition: A | Discharge status: Home / Self Care | Payer: No Typology Code available for payment source | Attending: Emergency Medicine | Admitting: Emergency Medicine

## 2015-11-23 ENCOUNTER — Encounter (HOSPITAL_COMMUNITY): Payer: Self-pay | Admitting: Emergency Medicine

## 2015-11-23 DIAGNOSIS — T401X1A Poisoning by heroin, accidental (unintentional), initial encounter: Secondary | ICD-10-CM | POA: Insufficient documentation

## 2015-11-23 DIAGNOSIS — T50901A Poisoning by unspecified drugs, medicaments and biological substances, accidental (unintentional), initial encounter: Secondary | ICD-10-CM

## 2015-11-23 MED ORDER — NALOXONE HCL 2 MG/2ML IJ SOSY
1.0000 mg | PREFILLED_SYRINGE | Freq: Once | INTRAMUSCULAR | Status: AC
  Administered 2015-11-23: 1 mg via SUBCUTANEOUS
  Filled 2015-11-23: qty 2

## 2015-11-23 MED ORDER — NALOXONE HCL 2 MG/2ML IJ SOSY
2.0000 mg | PREFILLED_SYRINGE | Freq: Once | INTRAMUSCULAR | Status: AC
  Administered 2015-11-23: 2 mg via INTRAVENOUS
  Filled 2015-11-23: qty 2

## 2015-11-23 MED ORDER — NALOXONE HCL 2 MG/2ML IJ SOSY
1.0000 mg | PREFILLED_SYRINGE | Freq: Once | INTRAMUSCULAR | Status: AC
  Administered 2015-11-23: 1 mg via SUBCUTANEOUS

## 2015-11-23 NOTE — ED Triage Notes (Signed)
Patient here via EMS found in car unresponsive. No narcan given. Currently responding reporting that he od on heroin, last use 2 hours ago.

## 2015-11-23 NOTE — Progress Notes (Addendum)
Pt noted from [facility] without a pcp listed  Cm went to speak with pt Called his name x 3 standing beside his bed  Pt with eyes closed, respirations even and non labored, Snoring sounds noted  Pt seen last in 2016 for polysubstance abuse  CM left pt with a list of uninsured clinics near [place]  to assist with follow up care to pcp Placed resources in a pt belonging bag put on pt bed ED Registration indicates pt without [employer] golden rule coverage verification

## 2015-11-23 NOTE — ED Provider Notes (Signed)
[facility]-EMERGENCY DEPT Provider Note   CSN: [id number] Arrival date & time: 11/23/15  1214     History   Chief Complaint Chief Complaint  Patient presents with  . Drug Overdose    HPI Juan Robinson is a 40 y.o. male.  The history is provided by the patient.  Drug Overdose  This is a new problem. The current episode started 1 to 2 hours ago. The problem occurs constantly. The problem has not changed since onset.Pertinent negatives include no chest pain, no abdominal pain and no shortness of breath. Nothing aggravates the symptoms. Nothing relieves the symptoms. He has tried nothing for the symptoms.    Past Medical History:  Diagnosis Date  . Anxiety   . Panic attacks   . Polysubstance abuse     Patient Active Problem List   Diagnosis Date Noted  . Substance induced mood disorder (HCC) 12/27/2014  . Polysubstance abuse 12/26/2014    Past Surgical History:  Procedure Laterality Date  . HERNIA REPAIR         Home Medications    Prior to Admission medications   Medication Sig Start Date End Date Taking? Authorizing Provider  buPROPion (WELLBUTRIN SR) 100 MG 12 hr tablet Take 1 tablet (100 mg total) by mouth daily. 12/28/14   Thermon LeylandLaura A Davis, NP  cloNIDine (CATAPRES) 0.1 MG tablet Please take one tablet of Clonidine 0.1 mg every six hours as needed for symptoms of opiate withdrawal for the next two days. Patient reported having this medication at home to help with detox symptoms after discharge from the Observation Unit. 12/28/14   Thermon LeylandLaura A Davis, NP  dicyclomine (BENTYL) 20 MG tablet Take 1 tablet (20 mg total) by mouth every 6 (six) hours as needed for spasms (abdominal cramping). 12/28/14   Thermon LeylandLaura A Davis, NP  gabapentin (NEURONTIN) 600 MG tablet Take 1 tablet (600 mg total) by mouth 2 (two) times daily. 12/28/14   Thermon LeylandLaura A Davis, NP  methocarbamol (ROBAXIN) 500 MG tablet Take 1 tablet (500 mg total) by mouth every 8 (eight) hours as needed for muscle spasms. 12/28/14    Thermon LeylandLaura A Davis, NP  Multiple Vitamin (MULTIVITAMIN WITH MINERALS) TABS tablet Take 1 tablet by mouth daily. 12/28/14   Thermon LeylandLaura A Davis, NP  ondansetron (ZOFRAN-ODT) 4 MG disintegrating tablet Take 1 tablet (4 mg total) by mouth every 6 (six) hours as needed for nausea or vomiting. 12/28/14   Thermon LeylandLaura A Davis, NP  QUEtiapine (SEROQUEL) 50 MG tablet Take 1 tablet (50 mg total) by mouth at bedtime. 12/28/14   Thermon LeylandLaura A Davis, NP  thiamine 100 MG tablet Take 1 tablet (100 mg total) by mouth daily. 12/28/14   Thermon LeylandLaura A Davis, NP    Family History History reviewed. No pertinent family history.  Social History Social History  Substance Use Topics  . Smoking status: Never Smoker  . Smokeless tobacco: Never Used  . Alcohol use Yes     Comment: drinks 4 days a week, "whatever i can get"     Allergies   Review of patient's allergies indicates no known allergies.   Review of Systems Review of Systems  Respiratory: Negative for shortness of breath.   Cardiovascular: Negative for chest pain.  Gastrointestinal: Negative for abdominal pain.  All other systems reviewed and are negative.    Physical Exam Updated Vital Signs BP 121/84   Pulse 88   Temp 98.5 F (36.9 C) (Oral)   Resp 16   SpO2 97%   Physical Exam  Constitutional: He is oriented to person, place, and time. He appears well-developed and well-nourished. He appears lethargic. No distress.  HENT:  Head: Normocephalic and atraumatic.  Nose: Nose normal.  Eyes: Conjunctivae are normal.  Neck: Neck supple. No tracheal deviation present.  Cardiovascular: Normal rate, regular rhythm and normal heart sounds.   Pulmonary/Chest: Effort normal and breath sounds normal. No respiratory distress.  Abdominal: Soft. He exhibits no distension.  Neurological: He is oriented to person, place, and time. He appears lethargic.  Skin: Skin is warm and dry.  Psychiatric: He has a normal mood and affect.     ED Treatments / Results  Labs (all labs  ordered are listed, but only abnormal results are displayed) Labs Reviewed - No data to display  EKG  EKG Interpretation  Date/Time:  Tuesday November 23 2015 12:24:27 EDT Ventricular Rate:  86 PR Interval:    QRS Duration: 89 QT Interval:  373 QTC Calculation: 447 R Axis:   69 Text Interpretation:  Sinus rhythm RSR' in V1 or V2, probably normal variant Otherwise normal ECG No previous tracing Confirmed by Bane Hagy MD, Aramis Weil ([id number]) on 11/23/2015 12:49:32 PM       Radiology No results found.  Procedures Procedures (including critical care time)  Medications Ordered in ED Medications  naloxone [facility]) injection 1 mg (1 mg Subcutaneous Given 11/23/15 1302)  naloxone (NARCAN) injection 1 mg (1 mg Subcutaneous Given 11/23/15 1350)  naloxone (NARCAN) injection 2 mg (2 mg Intravenous Given 11/23/15 1352)     Initial Impression / Assessment and Plan / ED Course  I have reviewed the triage vital signs and the nursing notes.  Pertinent labs & imaging results that were available during my care of the patient were reviewed by me and considered in my medical decision making (see chart for details).  Clinical Course    40 y.o. male presents with accidental overdose. He was found in a parking lot unresponsive in his car and arrives very somnolent. He was given 1 mg narcan IN and subcutaneously then given 2 mg IV with good response. He remains sleepy but is not desaturating. He states he was waiting to go to rehab and took klonopin and used multiple drugs then overdosed on heroin in anticipation of rehab accidentally. Care transferred to Dr Juleen China pending reassessment to achieve clinical sobriety and anticipate discharge.   Final Clinical Impressions(s) / ED Diagnoses   Final diagnoses:  Accidental drug overdose, initial encounter    New Prescriptions Discharge Medication List as of 11/23/2015  5:03 PM       Lyndal Pulley, MD 11/23/15 1916

## 2015-11-23 NOTE — ED Provider Notes (Signed)
Pt has been observed in the ED almost 5 hours now. Answering questions appropriately. HD stable. Reports he called his cousin to pick him up to shower and change before he was going to check himself into rehab. He subsequently accidentally OD'd. He denies SI. He is not psychotic. I feel he can be safely discharged at this point to follow-up for rehab. He will be given resources list.    Raeford Razor, MD 11/23/15 [phone]

## 2015-11-23 NOTE — ED Notes (Signed)
Bed: [id number] Expected date:  Expected time:  Means of arrival:  Comments: EMS-heroin OD

## 2015-11-24 DIAGNOSIS — F1910 Other psychoactive substance abuse, uncomplicated: Secondary | ICD-10-CM

## 2015-11-24 DIAGNOSIS — F418 Other specified anxiety disorders: Secondary | ICD-10-CM

## 2015-11-24 DIAGNOSIS — F1120 Opioid dependence, uncomplicated: Secondary | ICD-10-CM

## 2015-11-26 ENCOUNTER — Inpatient Hospital Stay (HOSPITAL_COMMUNITY)
Admission: AD | Admit: 2015-11-26 | Discharge: 2015-11-30 | DRG: 885 | Disposition: A | Discharge status: Home / Self Care | Payer: No Typology Code available for payment source | Attending: Psychiatry | Admitting: Psychiatry

## 2015-11-26 ENCOUNTER — Encounter (HOSPITAL_COMMUNITY): Payer: Self-pay | Admitting: Emergency Medicine

## 2015-11-26 DIAGNOSIS — Z79899 Other long term (current) drug therapy: Secondary | ICD-10-CM

## 2015-11-26 DIAGNOSIS — F1910 Other psychoactive substance abuse, uncomplicated: Secondary | ICD-10-CM | POA: Diagnosis present

## 2015-11-26 DIAGNOSIS — Z23 Encounter for immunization: Secondary | ICD-10-CM

## 2015-11-26 DIAGNOSIS — F332 Major depressive disorder, recurrent severe without psychotic features: Principal | ICD-10-CM | POA: Diagnosis present

## 2015-11-26 DIAGNOSIS — R45851 Suicidal ideations: Secondary | ICD-10-CM | POA: Diagnosis present

## 2015-11-26 DIAGNOSIS — G4700 Insomnia, unspecified: Secondary | ICD-10-CM | POA: Diagnosis present

## 2015-11-26 DIAGNOSIS — B1920 Unspecified viral hepatitis C without hepatic coma: Secondary | ICD-10-CM | POA: Diagnosis present

## 2015-11-26 DIAGNOSIS — F1123 Opioid dependence with withdrawal: Secondary | ICD-10-CM | POA: Diagnosis present

## 2015-11-26 MED ORDER — ALUM & MAG HYDROXIDE-SIMETH 200-200-20 MG/5ML PO SUSP
30.0000 mL | ORAL | Status: DC | PRN
  Administered 2015-11-27 – 2015-11-29 (×2): 30 mL via ORAL
  Filled 2015-11-26 (×2): qty 30

## 2015-11-26 MED ORDER — DICYCLOMINE HCL 20 MG PO TABS
20.0000 mg | ORAL_TABLET | Freq: Four times a day (QID) | ORAL | Status: DC | PRN
  Administered 2015-11-26: 20 mg via ORAL
  Filled 2015-11-26: qty 1

## 2015-11-26 MED ORDER — MAGNESIUM HYDROXIDE 400 MG/5ML PO SUSP
30.0000 mL | Freq: Every day | ORAL | Status: DC | PRN
  Administered 2015-11-28 – 2015-11-29 (×2): 30 mL via ORAL
  Filled 2015-11-26 (×2): qty 30

## 2015-11-26 MED ORDER — CLONIDINE HCL 0.1 MG PO TABS
0.1000 mg | ORAL_TABLET | Freq: Every day | ORAL | Status: DC
  Administered 2015-11-30: 0.1 mg via ORAL
  Filled 2015-11-26 (×2): qty 1

## 2015-11-26 MED ORDER — ONDANSETRON 4 MG PO TBDP: 4.0000 mg | ORAL_TABLET | Freq: Four times a day (QID) | ORAL | Status: AC | PRN

## 2015-11-26 MED ORDER — NAPROXEN 500 MG PO TABS
500.0000 mg | ORAL_TABLET | Freq: Two times a day (BID) | ORAL | Status: DC | PRN
  Administered 2015-11-27 – 2015-11-28 (×2): 500 mg via ORAL
  Filled 2015-11-26 (×2): qty 1

## 2015-11-26 MED ORDER — LOPERAMIDE HCL 2 MG PO CAPS: 2.0000 mg | ORAL_CAPSULE | ORAL | Status: AC | PRN

## 2015-11-26 MED ORDER — TRAZODONE HCL 50 MG PO TABS
50.0000 mg | ORAL_TABLET | Freq: Every evening | ORAL | Status: DC | PRN
  Administered 2015-11-26 – 2015-11-27 (×2): 50 mg via ORAL
  Filled 2015-11-26 (×2): qty 1

## 2015-11-26 MED ORDER — HYDROXYZINE HCL 25 MG PO TABS
25.0000 mg | ORAL_TABLET | Freq: Four times a day (QID) | ORAL | Status: DC | PRN
  Administered 2015-11-28 – 2015-11-29 (×4): 25 mg via ORAL
  Filled 2015-11-26: qty 1
  Filled 2015-11-26: qty 10
  Filled 2015-11-26 (×3): qty 1

## 2015-11-26 MED ORDER — VITAMIN B-1 100 MG PO TABS
100.0000 mg | ORAL_TABLET | Freq: Every day | ORAL | Status: DC
Start: 2015-11-27 — End: 2015-11-30
  Administered 2015-11-27 – 2015-11-30 (×4): 100 mg via ORAL
  Filled 2015-11-26 (×6): qty 1

## 2015-11-26 MED ORDER — LORAZEPAM 1 MG PO TABS
1.0000 mg | ORAL_TABLET | Freq: Three times a day (TID) | ORAL | Status: AC
  Administered 2015-11-27 (×3): 1 mg via ORAL
  Filled 2015-11-26 (×3): qty 1

## 2015-11-26 MED ORDER — LORAZEPAM 1 MG PO TABS
1.0000 mg | ORAL_TABLET | Freq: Two times a day (BID) | ORAL | Status: AC
  Administered 2015-11-28 (×2): 1 mg via ORAL
  Filled 2015-11-26 (×2): qty 1

## 2015-11-26 MED ORDER — CLONIDINE HCL 0.1 MG PO TABS
0.1000 mg | ORAL_TABLET | Freq: Four times a day (QID) | ORAL | Status: AC
  Administered 2015-11-26 – 2015-11-27 (×5): 0.1 mg via ORAL
  Filled 2015-11-26 (×8): qty 1

## 2015-11-26 MED ORDER — PNEUMOCOCCAL VAC POLYVALENT 25 MCG/0.5ML IJ INJ
0.5000 mL | INJECTION | INTRAMUSCULAR | Status: AC
  Administered 2015-11-27: 0.5 mL via INTRAMUSCULAR

## 2015-11-26 MED ORDER — CLONIDINE HCL 0.1 MG PO TABS
0.1000 mg | ORAL_TABLET | ORAL | Status: AC
  Administered 2015-11-28: 0.1 mg via ORAL
  Filled 2015-11-26 (×4): qty 1

## 2015-11-26 MED ORDER — ADULT MULTIVITAMIN W/MINERALS CH
1.0000 | ORAL_TABLET | Freq: Every day | ORAL | Status: DC
  Administered 2015-11-27 – 2015-11-30 (×4): 1 via ORAL
  Filled 2015-11-26 (×7): qty 1

## 2015-11-26 MED ORDER — LORAZEPAM 1 MG PO TABS
1.0000 mg | ORAL_TABLET | Freq: Every day | ORAL | Status: AC
  Administered 2015-11-29: 1 mg via ORAL
  Filled 2015-11-26: qty 1

## 2015-11-26 MED ORDER — LORAZEPAM 1 MG PO TABS
1.0000 mg | ORAL_TABLET | Freq: Four times a day (QID) | ORAL | Status: AC | PRN
  Administered 2015-11-27 – 2015-11-28 (×4): 1 mg via ORAL
  Filled 2015-11-26 (×4): qty 1

## 2015-11-26 MED ORDER — ACETAMINOPHEN 325 MG PO TABS
650.0000 mg | ORAL_TABLET | Freq: Four times a day (QID) | ORAL | Status: DC | PRN
  Filled 2015-11-26: qty 2

## 2015-11-26 MED ORDER — QUETIAPINE FUMARATE 50 MG PO TABS
50.0000 mg | ORAL_TABLET | Freq: Three times a day (TID) | ORAL | Status: DC | PRN
  Administered 2015-11-26 – 2015-11-28 (×2): 50 mg via ORAL
  Filled 2015-11-26 (×2): qty 1

## 2015-11-26 MED ORDER — THIAMINE HCL 100 MG/ML IJ SOLN
100.0000 mg | Freq: Once | INTRAMUSCULAR | Status: AC
  Administered 2015-11-26: 100 mg via INTRAMUSCULAR

## 2015-11-26 MED ORDER — LORAZEPAM 1 MG PO TABS
1.0000 mg | ORAL_TABLET | Freq: Four times a day (QID) | ORAL | Status: AC
  Administered 2015-11-26 (×2): 1 mg via ORAL
  Filled 2015-11-26 (×2): qty 1

## 2015-11-26 MED ORDER — METHOCARBAMOL 500 MG PO TABS
500.0000 mg | ORAL_TABLET | Freq: Three times a day (TID) | ORAL | Status: DC | PRN
  Administered 2015-11-26: 500 mg via ORAL
  Filled 2015-11-26 (×2): qty 1
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
## 2015-11-26 NOTE — BH Assessment (Signed)
Tele Assessment Note  Assessment done by [name] on 11/25/15 while pt at [facility] ED. All info in this assessment note was obtained from original teleassessment 11/25/15:  Pt is a 39 yo male who took an intentional overdose of drugs (heroin, cocaine & methamphetamine). Pt reports that he does not remember how many drugs he took. Pt reports that he is not able to contract for safety.  Pt reports that he is divorced and does not have any other family. Pt reports that his fiance has left him and now he is homeless. Pt reports that he no longer has his job as of Jan 2017.  Pt reports increased depression associated with his inability to stop using heroin. Pt reports that he has been addicted to heroin since the age of 31. Pt reports that he was in a psychiatric hospital in February 2017. Pt reports that he was diagnosed with Bipolar disorder, anxiety disorder and ADHD. Pt reports that he is non compliant with taking his psychiatric medication Pt denies HI/Psychosis.   Juan Robinson is an 40 y.o. male.   Diagnosis: Anxiety D/O MDD, Severe Polysubstance Dependence including opioid type drug, episodic abuse   Past Medical History:  Past Medical History:  Diagnosis Date  . Anxiety   . Panic attacks   . Polysubstance abuse     Past Surgical History:  Procedure Laterality Date  . HERNIA REPAIR      Family History: No family history on file.  Social History:  reports that he has never smoked. He has never used smokeless tobacco. He reports that he drinks alcohol. He reports that he uses drugs, including IV, Cocaine, Marijuana, Methamphetamines, and Heroin, about 7 times per week.  Additional Social History:  Alcohol / Drug Use Pain Medications: pt endorses abuse when he can get them Prescriptions: pt endorses abuse when he can get them Over the Counter: pt denies - see pta meds list History of alcohol / drug use?: Yes Negative Consequences of Use: Financial, Personal relationships,  [employer]ed forces operational officer, Work / School Substance #1 Name of Substance 1: heroin  1 - Age of First Use: pt sts addicted since age 29 1 - Last Use / Amount: pt reports still addicted to heroin - most recent UDS + opioids Substance #2 Name of Substance 2: crystal meth 2 - Frequency: pt sts he uses drug regularly 2 - Last Use / Amount: most recent UDS + for methamphetamines Substance #3 Name of Substance 3: cocaine 3 - Age of First Use: 10 3 - Frequency: pt sts uses cocaine regularly 3 - Last Use / Amount: most recent UDS + cocaine  CIWA:   COWS:    PATIENT STRENGTHS: (choose at least two) Capable of independent living Communication skills Physical Health Work skills  Allergies: No Known Allergies  Home Medications:  (Not in a hospital admission)  OB/GYN Status:  No LMP for male patient.  General Assessment Data [facility] of Assessment: [facility] TTS Assessment: Out of system Is this a Tele or Face-to-Face Assessment?: Tele Assessment Is this an Initial Assessment or a Re-assessment for this encounter?: Initial Assessment Marital status: Divorced [place] name: n Is patient pregnant?: No Pregnancy Status: No Living Arrangements: Spouse/significant other Can pt return to current living arrangement?: Yes Admission Status: Involuntary Is patient capable of signing voluntary admission?: Yes Referral Source: Self/Family/Friend Insurance type: united healthcare     Crisis Care Plan Living Arrangements: Spouse/significant other  Education Status Is patient currently in school?: No  Risk to self  with the past 6 months Suicidal Ideation: Yes-Currently Present Has patient been a risk to self within the past 6 months prior to admission? : Yes Suicidal Intent: No Has patient had any suicidal intent within the past 6 months prior to admission? : Yes Is patient at risk for suicide?: Yes Has patient had any suicidal plan within the past 6 months prior to admission? : Yes Access to  Means: Yes Specify Access to Suicidal Means: pt took intentional OD on heroin,coke & meth 11/25/15 What has been your use of drugs/alcohol within the last 12 months?: regular use of coke, meth & heroin Previous Attempts/Gestures: No Other Self Harm Risks: none Intentional Self Injurious Behavior: None Family Suicide History: Unable to assess Recent stressful life event(s): Job Loss, Divorce, Turmoil (Comment), Financial Problems (fiance left him, now homeless) Persecutory voices/beliefs?: No Depression: Yes Depression Symptoms: Tearfulness, Despondent, Fatigue, Isolating, Guilt, Feeling worthless/self pity, Loss of interest in usual pleasures (poor concentration) Substance abuse history and/or treatment for substance abuse?: Yes Suicide prevention information given to non-admitted patients: Not applicable  Risk to Others within the past 6 months Homicidal Ideation: No Does patient have any lifetime risk of violence toward others beyond the six months prior to admission? : No Thoughts of Harm to Others: No Current Homicidal Intent: No Current Homicidal Plan: No Access to Homicidal Means: No Identified Victim: none History of harm to others?: No Assessment of Violence: None Noted Criminal Charges Pending?: Yes Describe Pending Criminal Charges: misdemeanor larceny Does patient have a court date: Yes Court Date: 12/22/15 Is patient on probation?: No  Psychosis Hallucinations: None noted Delusions: None noted  Mental Status Report Appearance/Hygiene: Unremarkable Eye Contact: Fair Motor Activity: Freedom of movement Speech: Logical/coherent Level of Consciousness: Alert Mood: Depressed, Sad Affect: Appropriate to circumstance, Depressed Anxiety Level: Moderate Thought Processes: Relevant, Coherent Judgement: Impaired Orientation: Person, Place, Time, Situation Obsessive Compulsive Thoughts/Behaviors: None  Cognitive Functioning Concentration: Decreased IQ:  Average Insight: Poor Impulse Control: Poor Vegetative Symptoms: None  ADLScreening [facility]) Patient's cognitive ability adequate to safely complete daily activities?: Yes Patient able to express need for assistance with ADLs?: Yes Independently performs ADLs?: Yes (appropriate for developmental age)  Prior Inpatient Therapy Prior Inpatient Therapy: Yes Prior Therapy Dates: Feb 2017 Prior Therapy Facilty/Provider(s): unknown Reason for Treatment: unknown     ADL Screening (condition at time of admission) Patient's cognitive ability adequate to safely complete daily activities?: Yes Is the patient deaf or have difficulty hearing?: No Does the patient have difficulty seeing, even when wearing glasses/contacts?: No Patient able to express need for assistance with ADLs?: Yes Does the patient have difficulty dressing or bathing?: No Independently performs ADLs?: Yes (appropriate for developmental age)  Home Assistive Devices/Equipment Home Assistive Devices/Equipment: None          Advance Directives (For Healthcare) Does patient have an advance directive?: No Would patient like information on creating an advanced directive?: No - patient declined information    Additional Information 1:1 In Past 12 Months?: No CIRT Risk: No Elopement Risk: No Does patient have medical clearance?: Yes     Disposition:  Disposition Initial Assessment Completed for this Encounter: Yes Disposition of Patient: Inpatient treatment program [facility]  Jetty Berland P 11/26/2015 9:23 AM
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
## 2015-11-26 NOTE — [facility] Suicide Risk Assessment (Signed)
[facility]   Nursing information obtained from:    Demographic factors:    Current Mental Status:    Loss Factors:    Historical Factors:    Risk Reduction Factors:     Total Time spent with patient: 20 minutes Principal Problem: Opioid dependence with withdrawal (HCC) Diagnosis:   Patient Active Problem List   Diagnosis Date Noted  . MDD (major depressive disorder), recurrent episode, severe (HCC) [F33.2] 11/26/2015  . Opioid dependence with withdrawal (HCC) [F11.23] 11/26/2015  . Substance induced mood disorder (HCC) [F19.94] 12/27/2014  . Polysubstance abuse [F19.10] 12/26/2014   Subjective Data: Patient denies current suicidal or homicidal ideation, plan or intent.  Continued Clinical Symptoms:    The "Alcohol Use Disorders Identification Test", Guidelines for Use in Primary Care, Second Edition.  World Science writerHealth Organization [facility](WHO). Score between 0-7:  no or low risk or alcohol related problems. Score between 8-15:  moderate risk of alcohol related problems. Score between 16-19:  high risk of alcohol related problems. Score 20 or above:  warrants further diagnostic evaluation for alcohol dependence and treatment.   CLINICAL FACTORS:   Dysthymia Alcohol/Substance Abuse/Dependencies More than one psychiatric diagnosis Previous Psychiatric Diagnoses and Treatments   Musculoskeletal: Strength & Muscle Tone: within normal limits Gait & Station: unsteady Patient leans: N/A  Psychiatric Specialty Exam: Physical Exam  ROS  There were no vitals taken for this visit.There is no height or weight on file to calculate BMI.     General Appearance: Casual  Eye Contact:  Minimal  Speech:  Clear and Coherent  Volume:  Normal  Mood:  Dysphoric and Irritable  Affect:  Congruent  Thought Process:  Coherent  Orientation:  Negative  Thought Content:  Negative  Suicidal Thoughts:  Yes.  without intent/plan  Homicidal Thoughts:  No  Memory:  Negative   Judgement:  Poor  Insight:  Shallow  Psychomotor Activity:  Decreased  Concentration:  Concentration: Fair and Attention Span: Fair  Recall:  [employer]  Fund of Knowledge:  Good  Language:  Good  Akathisia:  No  Handed:  Right  AIMS (if indicated):     Assets:  Resilience  ADL's:  Intact  Cognition:  WNL  Sleep:         COGNITIVE FEATURES THAT CONTRIBUTE TO RISK:  Loss of executive function    SUICIDE RISK:   Mild:  Suicidal ideation of limited frequency, intensity, duration, and specificity.  There are no identifiable plans, no associated intent, mild dysphoria and related symptoms, good self-control (both objective and subjective assessment), few other risk factors, and identifiable protective factors, including available and accessible social support. Pt has risk of self-harm from severe substance use disorder however, engaging in risky behaviors and becoming disinhibited.   PLAN OF CARE: see PAA  I certify that inpatient services furnished can reasonably be expected to improve the patient's condition.  Acquanetta SitElizabeth Woods Phat Dalton, MD 11/26/2015, 2:19 PM

## 2015-11-26 NOTE — Tx Team (Signed)
Initial Treatment Plan 11/26/2015 4:17 PM Juan FreezePreston Varas EAV:[id number]    PATIENT STRESSORS: Educational concerns Financial difficulties Health problems Legal issue   PATIENT STRENGTHS: Ability for insight Active sense of humor Average or above average intelligence Capable of independent living   PATIENT IDENTIFIED PROBLEMS: Bipolar Disorder  Depression with Suicidality        " I don't know what to do"  " I don't want to talk about it"         DISCHARGE CRITERIA:  Ability to meet basic life and health needs Adequate post-discharge living arrangements Improved stabilization in mood, thinking, and/or behavior Medical problems require only outpatient monitoring  PRELIMINARY DISCHARGE PLAN: Attend aftercare/continuing care group Attend PHP/IOP Outpatient therapy  PATIENT/FAMILY INVOLVEMENT: This treatment plan has been presented to and reviewed with the patient, Juan Robinson, and/or family member, .  The patient and family have been given the opportunity to ask questions and make suggestions.  Juan Robinson, Juan Hanssen Lynn, RN 11/26/2015, 4:17 PM

## 2015-11-27 LAB — CBC WITH DIFFERENTIAL/PLATELET
BASOS ABS: 0 10*3/uL (ref 0.0–0.1)
BASOS PCT: 0 %
EOS ABS: 0.3 10*3/uL (ref 0.0–0.7)
Eosinophils Relative: 4 %
HEMATOCRIT: 37.8 % — AB (ref 39.0–52.0)
HEMOGLOBIN: 13.1 g/dL (ref 13.0–17.0)
Lymphocytes Relative: 23 %
Lymphs Abs: 1.4 10*3/uL (ref 0.7–4.0)
MCH: 31.8 pg (ref 26.0–34.0)
MCHC: 34.7 g/dL (ref 30.0–36.0)
MCV: 91.7 fL (ref 78.0–100.0)
MONOS PCT: 9 %
Monocytes Absolute: 1 10*3/uL (ref 0.1–1.0)
NEUTROS ABS: 4 10*3/uL (ref 1.7–7.7)
NEUTROS PCT: 64 %
Platelets: 226 10*3/uL (ref 150–400)
RBC MORPHOLOGY: 0
RBC: 4.12 MIL/uL — ABNORMAL LOW (ref 4.22–5.81)
RDW: 12.8 % (ref 11.5–15.5)
Smear Review: 0
WBC MORPHOLOGY: 0
WBC: 6.1 10*3/uL (ref 4.0–10.5)
nRBC: 0 /100 WBC

## 2015-11-27 LAB — TSH: TSH: 1.055 u[IU]/mL (ref 0.350–4.500)

## 2015-11-27 LAB — COMPREHENSIVE METABOLIC PANEL
ALK PHOS: 41 U/L (ref 38–126)
ALT: 152 U/L — AB (ref 17–63)
ANION GAP: 6 (ref 5–15)
AST: 83 U/L — ABNORMAL HIGH (ref 15–41)
Albumin: 3.8 g/dL (ref 3.5–5.0)
BILIRUBIN TOTAL: 0.6 mg/dL (ref 0.3–1.2)
BUN: 10 mg/dL (ref 6–20)
CALCIUM: 9.2 mg/dL (ref 8.9–10.3)
CO2: 28 mmol/L (ref 22–32)
CREATININE: 1.12 mg/dL (ref 0.61–1.24)
Chloride: 106 mmol/L (ref 101–111)
Glucose, Bld: 141 mg/dL — ABNORMAL HIGH (ref 65–99)
Potassium: 3.8 mmol/L (ref 3.5–5.1)
Sodium: 140 mmol/L (ref 135–145)
TOTAL PROTEIN: 6.5 g/dL (ref 6.5–8.1)

## 2015-11-27 LAB — LIPID PANEL
Cholesterol: 94 mg/dL (ref 0–200)
HDL: 32 mg/dL — AB (ref >40)
LDL CALC: 41 mg/dL (ref 0–99)
TRIGLYCERIDES: 103 mg/dL (ref <150)
Total CHOL/HDL Ratio: 2.9 RATIO
VLDL: 21 mg/dL (ref 0–40)

## 2015-11-27 LAB — RPR: RPR: NONREACTIVE

## 2015-11-27 LAB — VITAMIN B12: VITAMIN B 12: 506 pg/mL (ref 180–914)

## 2015-11-27 LAB — HIV ANTIBODY (ROUTINE TESTING W REFLEX): HIV Screen 4th Generation wRfx: NONREACTIVE

## 2015-11-27 MED ORDER — QUETIAPINE FUMARATE 100 MG PO TABS
100.0000 mg | ORAL_TABLET | Freq: Every day | ORAL | Status: DC
  Administered 2015-11-27 – 2015-11-29 (×3): 100 mg via ORAL
  Filled 2015-11-27 (×5): qty 1

## 2015-11-27 MED ORDER — SERTRALINE HCL 25 MG PO TABS
25.0000 mg | ORAL_TABLET | Freq: Every day | ORAL | Status: DC
  Administered 2015-11-27 – 2015-11-30 (×4): 25 mg via ORAL
  Filled 2015-11-27 (×6): qty 1

## 2015-11-27 MED ORDER — QUETIAPINE FUMARATE 50 MG PO TABS
50.0000 mg | ORAL_TABLET | Freq: Every day | ORAL | Status: DC
  Administered 2015-11-27 – 2015-11-30 (×4): 50 mg via ORAL
  Filled 2015-11-27 (×7): qty 1

## 2015-11-27 MED ORDER — INFLUENZA VAC SPLIT QUAD 0.5 ML IM SUSY
0.5000 mL | PREFILLED_SYRINGE | INTRAMUSCULAR | Status: AC
  Administered 2015-11-28: 0.5 mL via INTRAMUSCULAR
  Filled 2015-11-27: qty 0.5

## 2015-11-27 NOTE — [facility] Group Notes (Signed)
[facility] LCSW Group Therapy Note  11/27/2015 and 10:00 AM  Type of Therapy and Topic:  Group Therapy: Avoiding Self-Sabotaging and Enabling Behaviors  Participation Level:  Minimal  Participation Quality:  Drowsy and Inattentive  Affect:  Flat  Cognitive:  Alert  Insight:  None shared  Engagement in Therapy:  None   Therapeutic models used: Cognitive Behavioral Therapy,  Person-Centered Therapy and Motivational Interviewing  Modes of Intervention:  Discussion, Exploration, Orientation, Rapport Building, Socialization and Support   Summary of Progress/Problems:  The main focus of today's process group was for the patient to identify ways in which they have in the past sabotaged their own recovery. Motivational Interviewing was utilized to identify motivation they may have for wanting to change. The Stages of Change were explained using a handout, and patients identified where they currently are with regard to stages of change. Patient left after about 15 minutes of group after only sharing his name.   Carney Bernatherine C Harrill, LCSW

## 2015-11-27 NOTE — Progress Notes (Addendum)
Juan Robinson is up this morning. He takes his scheduled meds as planned. He complains of anxiety , says " I feel so bad....when can I start to feel better...". A HE completed his daily assessment and writes he is having SI this am ( he contracts with Clinical research associatewriter to not hurt self today) and he rates his depression, anxiety and hopelessness " 11/07/08", respectively. He is very relieved after talking to  His physician and says he feels " better" after he is restarted on seroquel. HE says seroquel has been  " the only thing" in the past that " has helped me". R He is beginning to regain hope and positive reinforcement is offered by this Clinical research associatewriter. Safety in place.

## 2015-11-27 NOTE — Progress Notes (Signed)
Patient ID: Juan Robinson, male   DOB: 11/14/1975, 40 y.o.   MRN: [id number]   Pt currently presents with a flat affect and guarded behavior. Pt reports to writer that their goal is to "sleep." Pt states "I thought I saw a television that was playing a video in my room this morning but when I went to turn it off, there wasn't a tv there." Pt reports poor sleep with current medication regimen. Pt reports increased anxiety since "being taken off of my diazepam." Pt reports signs and symptoms of withdrawal including anxiety/agitation and tremors.  Pt provided with medications per providers orders. Pt's labs and vitals were monitored throughout the night, pt trending hypotensive. Pt supported emotionally and encouraged to express concerns and questions. Pt educated on medications, substance withdrawal and encouraged fluids.  Pt's safety ensured with 15 minute and environmental checks. Pt endorses some AVH. Pt currently denies SI/HI. Pt verbally agrees to seek staff if SI/HI or A/VH occurs and to consult with staff before acting on any harmful thoughts. Pt requests to be started back on diazepam, reports that he will not take Klonopin if offered. Will continue POC.

## 2015-11-27 NOTE — [facility] Group Notes (Signed)
[facility] Group Notes:  (Nursing/MHT/Case Management/Adjunct)  Date:  11/27/2015  Time:  5:49 PM  Type of Therapy:  Nurse Education  Participation Level:  Did Not Jackquline BoschttendPenny G Lateshia Schmoker 11/27/2015, 5:49 PM
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
## 2015-11-27 NOTE — [facility] Suicide Risk Assessment (Signed)
[facility] INPATIENT:  Family/Significant Other Suicide Prevention Education  Suicide Prevention Education:  Contact Attempts: April MansonJason Risby, brother, at [phone] has been identified by the patient as the family member/significant other with whom the patient will be residing, and identified as the person(s) who will aid the patient in the event of a mental health crisis.  With written consent from the patient, two attempts were made to provide suicide prevention education, prior to and/or following the patient's discharge.  We were unsuccessful in providing suicide prevention education.  A suicide education pamphlet was given to the patient to share with family/significant other.  Date and time of first attempt: 11/27/2015 at 2:46 PM    Juan Robinson 11/27/2015, 2:46 PM

## 2015-11-27 NOTE — Progress Notes (Signed)
Pt is a new admit to the unit earlier this afternoon.  He was in bed during the early part of the shift.  He did not attend evening group, but did get up afterwards for a snack and to inquire about medications.  He was concerned that he was not getting the medications that he had been taking, but then states "am I being too dramatic about this?".  It seems he had not been taking his prescribed medications for a while, but had been self medicating with illegal drugs.  He was medicated per his orders and given prn meds for withdrawal symptoms.  Conversation was minimal.  Pt was encouraged to discuss his concerns about his medications with the doctor in the morning.  Pt contracts for safety.  He reports he is still hearing voices at times.   Support and encouragement offered.  Discharge plans are in process.  Safety maintained with q15 minute checks.

## 2015-11-27 NOTE — [facility] Group Notes (Signed)
Identifying Needs   Date:  11/27/2015  Time:  0915  Type of Therapy:  Education:  Idnetifying Needs:  The group is focused on teaching patients how to identify their needs as a coping skill.  Participation Level:  Good  Participation Quality:  Good  Affect:  Flat, depressed  Cognitive:  Intact  Insight:  Fair  Engagement in Group:  Engaged  Modes of Intervention:    Summary of Progress/Problems:  Rich BraveDuke, Michaelle Bottomley Lynn 11/27/2015, 12:24 PM

## 2015-11-27 NOTE — Progress Notes (Addendum)
[facility] MD Progress Note  11/27/2015 10:23 AM Juan Robinson  MRN:  [id number] Subjective:   Patient seen, chart reviewed and case discussed with nursing staff.  Patient states that he does not feel well. He talked about stress after release from jail in October 11; he feels remorse about not being able to go to her mother's funeral, who deceased on Oct 04, 2022.  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 He has "paranoia," stating that other people talking about him, trying to press his button, although he does not believe it is true. He has voices of yelling, although he declines to talk about it and further. He has VH of seeing the TV. He continues to have passive SI.   Principal Problem: Opioid dependence with withdrawal ([place]) Diagnosis:   Patient Active Problem List   Diagnosis Date Noted  . MDD (major depressive disorder), recurrent episode, severe ([place]) [F33.2] 11/26/2015  . Opioid dependence with withdrawal ([place]) [F11.23] 11/26/2015  . Substance induced mood disorder ([place]) [F19.94] 12/27/2014  . Polysubstance abuse [F19.10] 12/26/2014   Total Time spent with patient: 30 minutes  Past Psychiatric History: see HPI  Past Medical History:  Past Medical History:  Diagnosis Date  . Anxiety   . Panic attacks   . Polysubstance abuse     Past Surgical History:  Procedure Laterality Date  . HERNIA REPAIR     Family History: History reviewed. No pertinent family history. Family Psychiatric  History: see HPI Social History:  History  Alcohol Use  . Yes    Comment: drinks 4 days a week, "whatever i can get"     History  Drug Use  . Frequency: 7.0 times per week  . Types: IV, Cocaine, Marijuana, Methamphetamines, Heroin    Social History   Social History  . Marital status: Single    Spouse name: N/A  . Number of children: N/A  . Years of education: N/A   Social  History Main Topics  . Smoking status: Never Smoker  . Smokeless tobacco: Never Used  . Alcohol use Yes     Comment: drinks 4 days a week, "whatever i can get"  . Drug use:     Frequency: 7.0 times per week    Types: IV, Cocaine, Marijuana, Methamphetamines, Heroin  . Sexual activity: Yes    Birth control/ protection: None   Other Topics Concern  . None   Social History Narrative  . None   Additional Social History:    Pain Medications: none Prescriptions: pt endorses abuse when he can get them Over the Counter: pt denies - see pta meds list History of alcohol / drug use?: Yes Longest period of sobriety (when/how long): unknown Negative Consequences of Use: Financial, Legal, Personal relationships Withdrawal Symptoms: Agitation, Cramps Name of Substance 1: allcohol 1 - Age of First Use: 40yo 1 - Amount (size/oz): AMAP 1 - Last Use / Amount: Monday Name of Substance 2: pot 2 - Age of First Use: 40yo 2 - Frequency: pt sts he uses drug regularly 2 - Last Use / Amount: [place] Name of Substance 3: methamphetamine 3 - Age of First Use: 10 3 - Frequency: pt sts uses cocaine regularly 3 - Last Use / Amount: most recent UDS + cocaine Name of Substance 4: cocaine            Sleep: Poor  Appetite:  Fair  Current Medications: Current Facility-Administered Medications  Medication Dose Route Frequency Provider Last Rate Last Dose  . acetaminophen (TYLENOL) tablet 650 mg  650 mg Oral Q6H PRN Ethelene Hal, NP      . alum & mag hydroxide-simeth (MAALOX/MYLANTA) 200-200-20 MG/5ML suspension 30 mL  30 mL Oral Q4H PRN Ethelene Hal, NP   30 mL at 11/27/15 0234  . cloNIDine (CATAPRES) tablet 0.1 mg  0.1 mg Oral QID Linard Millers, MD   0.1 mg at 11/27/15 0825   Followed by  . [START ON 11/28/2015] cloNIDine (CATAPRES) tablet 0.1 mg  0.1 mg Oral BH-qamhs Linard Millers, MD       Followed by  . [START ON 11/30/2015] cloNIDine (CATAPRES) tablet 0.1 mg   0.1 mg Oral QAC breakfast Linard Millers, MD      . dicyclomine (BENTYL) tablet 20 mg  20 mg Oral Q6H PRN Linard Millers, MD   20 mg at 11/26/15 2126  . hydrOXYzine (ATARAX/VISTARIL) tablet 25 mg  25 mg Oral Q6H PRN Ethelene Hal, NP      . Derrill Memo ON 11/28/2015] Influenza vac split quadrivalent PF (FLUARIX) injection 0.5 mL  0.5 mL Intramuscular Tomorrow-1000 Rozetta Nunnery, NP      . loperamide (IMODIUM) capsule 2-4 mg  2-4 mg Oral PRN Linard Millers, MD      . LORazepam (ATIVAN) tablet 1 mg  1 mg Oral Q6H PRN Linard Millers, MD      . LORazepam (ATIVAN) tablet 1 mg  1 mg Oral TID Linard Millers, MD   1 mg at 11/27/15 0825   Followed by  . [START ON 11/28/2015] LORazepam (ATIVAN) tablet 1 mg  1 mg Oral BID Linard Millers, MD       Followed by  . [START ON 11/29/2015] LORazepam (ATIVAN) tablet 1 mg  1 mg Oral Daily Linard Millers, MD      . magnesium hydroxide (MILK OF MAGNESIA) suspension 30 mL  30 mL Oral Daily PRN Ethelene Hal, NP      . methocarbamol (ROBAXIN) tablet 500 mg  500 mg Oral Q8H PRN Linard Millers, MD   500 mg at 11/26/15 2126  . multivitamin with minerals tablet 1 tablet  1 tablet Oral Daily Linard Millers, MD   1 tablet at 11/27/15 0825  . naproxen (NAPROSYN) tablet 500 mg  500 mg Oral BID PRN Linard Millers, MD      . ondansetron (ZOFRAN-ODT) disintegrating tablet 4 mg  4 mg Oral Q6H PRN Linard Millers, MD      . pneumococcal 23 valent vaccine (PNU-IMMUNE) injection 0.5 mL  0.5 mL Intramuscular Tomorrow-1000 Myer Peer Cobos, MD      . QUEtiapine (SEROQUEL) tablet 50 mg  50 mg Oral Q8H PRN Linard Millers, MD   50 mg at 11/26/15 2127  . thiamine (B-1) injection 100 mg  100 mg Intramuscular Once Linard Millers, MD      . thiamine (VITAMIN B-1) tablet 100 mg  100 mg Oral Daily Linard Millers, MD   100 mg at 11/27/15 0825  . traZODone (DESYREL) tablet 50 mg  50 mg Oral  QHS PRN Ethelene Hal, NP   50 mg at 11/26/15 2127    Lab Results:  Results for orders placed or performed during the hospital encounter of 11/26/15 (from the past 48 hour(s))  Comprehensive metabolic panel  Status: Abnormal   Collection Time: 11/27/15  6:29 AM  Result Value Ref Range   Sodium 140 135 - 145 mmol/L   Potassium 3.8 3.5 - 5.1 mmol/L   Chloride 106 101 - 111 mmol/L   CO2 28 22 - 32 mmol/L   Glucose, Bld 141 (H) 65 - 99 mg/dL   BUN 10 6 - 20 mg/dL   Creatinine, Ser 1.12 0.61 - 1.24 mg/dL   Calcium 9.2 8.9 - 10.3 mg/dL   Total Protein 6.5 6.5 - 8.1 g/dL   Albumin 3.8 3.5 - 5.0 g/dL   AST 83 (H) 15 - 41 U/L   ALT 152 (H) 17 - 63 U/L   Alkaline Phosphatase 41 38 - 126 U/L   Total Bilirubin 0.6 0.3 - 1.2 mg/dL   GFR calc non Af Amer >60 >60 mL/min   GFR calc Af Amer >60 >60 mL/min    Comment: (NOTE) The eGFR has been calculated using the CKD EPI equation. This calculation has not been validated in all clinical situations. eGFR's persistently <60 mL/min signify possible Chronic Kidney Disease.    Anion gap 6 5 - 15    Comment: Performed at [facility]  CBC with Differential/Platelet     Status: Abnormal   Collection Time: 11/27/15  6:29 AM  Result Value Ref Range   WBC 6.1 4.0 - 10.5 K/uL   RBC 4.12 (L) 4.22 - 5.81 MIL/uL   Hemoglobin 13.1 13.0 - 17.0 g/dL   HCT 37.8 (L) 39.0 - 52.0 %   MCV 91.7 78.0 - 100.0 fL   MCH 31.8 26.0 - 34.0 pg   MCHC 34.7 30.0 - 36.0 g/dL   RDW 12.8 11.5 - 15.5 %   Platelets 226 150 - 400 K/uL   Neutrophils Relative % 64 %   Neutro Abs 4.0 1.7 - 7.7 K/uL   Lymphocytes Relative 23 %   Lymphs Abs 1.4 0.7 - 4.0 K/uL   Monocytes Relative 9 %   Monocytes Absolute 1.0 0.1 - 1.0 K/uL   Eosinophils Relative 4 %   Eosinophils Absolute 0.3 0.0 - 0.7 K/uL   Basophils Relative 0 %   Basophils Absolute 0 0.0 - 0.1 K/uL   WBC Morphology 0    RBC Morphology 0    Smear Review 0    nRBC 0 0 /100 WBC    Comment:  Performed at [facility]  TSH     Status: None   Collection Time: 11/27/15  6:29 AM  Result Value Ref Range   TSH 1.055 0.350 - 4.500 uIU/mL    Comment: Performed at [facility]    Blood Alcohol level:  Lab Results  Component Value Date   [facility] <5 12/26/2014   [facility]  09/25/2009    <5        LOWEST DETECTABLE LIMIT FOR SERUM ALCOHOL IS 5 mg/dL FOR MEDICAL PURPOSES ONLY    Metabolic Disorder Labs: No results found for: HGBA1C, MPG No results found for: PROLACTIN No results found for: CHOL, TRIG, HDL, CHOLHDL, VLDL, LDLCALC  Physical Findings: AIMS: Facial and Oral Movements Muscles of Facial Expression: None, normal Lips and Perioral Area: None, normal Jaw: None, normal Tongue: None, normal,Extremity Movements Upper (arms, wrists, hands, fingers): None, normal Lower (legs, knees, ankles, toes): None, normal, Trunk Movements Neck, shoulders, hips: None, normal, Overall Severity Severity of abnormal movements (highest score from questions above): None, normal Incapacitation due to abnormal movements: None, normal Patient's awareness of  abnormal movements (rate only patient's report): No Awareness, Dental Status Current problems with teeth and/or dentures?: No Does patient usually wear dentures?: No  CIWA:  CIWA-Ar Total: 5 COWS:  COWS Total Score: 3  Musculoskeletal: Strength & Muscle Tone: within normal limits Gait & Station: normal Patient leans: N/A  Psychiatric Specialty Exam: Physical Exam  Review of Systems  Neurological: Positive for tremors.  Psychiatric/Behavioral: Positive for depression, hallucinations, substance abuse and suicidal ideas. The patient is nervous/anxious and has insomnia.   All other systems reviewed and are negative.   Blood pressure 97/73, pulse 79, temperature 98.9 F (37.2 C), temperature source Oral, resp. rate 18, height 5' 11" (1.803 m), weight 165 lb (74.8 kg), SpO2 99 %.Body mass index is 23.01  kg/m.  General Appearance: Fairly Groomed  Eye Contact:  Poor  Speech:  Clear and Coherent  Volume:  Decreased  Mood:  Depressed  Affect:  Blunt  Thought Process:  Coherent and Goal Directed  Orientation:  Full (Time, Place, and Person)  Thought Content:  Logical Perceptions: AH of hearing voices, VH of seeing a TV, unclear if he has CAH (declines to answer)  Suicidal Thoughts:  Yes.  without intent/plan  Homicidal Thoughts:  No  Memory:  Immediate;   Fair Recent;   Fair Remote;   Fair  Judgement:  Impaired  Insight:  Shallow  Psychomotor Activity:  Normal  Concentration:  Concentration: Fair and Attention Span: Fair  Recall:  [employer] of Knowledge:  Fair  Language:  Fair  Akathisia:  No  Handed:  Right  AIMS (if indicated):     Assets:  Communication Skills Desire for Improvement  ADL's:  Intact  Cognition:  WNL  Sleep:  Number of Hours: 6.25   Assessment Juan Robinson is a 40 year old male with substance induced mood disorder, polysubstance use disorder (IV heroin, marijuana, meth, cocaine and alcohol), history of DT per chart, recent diagnosis of Hep C with recent presentation to ED for accidental overdose, admitted with SI after overdosing heroin, cocaine, methamphetamine. Psychosocial stressors including recent incarceration, unable to see his children, unemployment and death of his mother on 2015/10/09.   # MDD # bereavement # r/o substance induced mood disorder Patient continues to endorse passive SI with psychotic symptoms. Will start sertraline and scheduled quetiapine as augmentation treatment for his mood symptoms.   # Poly substance use disorder (IV heroin, marijuana, meth, cocaine and alcohol) Patient has been tolerating clonidine and Ativan protocol without significant side effects. Will continue protocol. Will monitor LFT (patient was recently diagnosed Hep C; needs referral upon discharge).  Plan - Start sertraline 25 mg daily - Start quetiapine 50 mg  daily, 100 mg qhs - Continue clonidine, Ativan protocol - Check LFT tomorrow - Medication management to reduce current symptoms to base line and improve the patient's overall level of functioning. - Monitor for the adverse effect of the medications and anger outbursts - Continue 15 minutes observation for safety concerns - Encouraged to participate in milieu therapy and group therapy counseling sessions and also work with coping skills -  Develop treatment plan to decrease risk of relapse upon discharge and to reduce the need for readmission. -  Psycho-social education regarding relapse prevention and self care. - Health care follow up as needed for medical problems. - Restart home medications where appropriate.  Treatment Plan Summary: Daily contact with patient to assess and evaluate symptoms and progress in treatment  Norman Clay, MD 11/27/2015, 10:23 AM

## 2015-11-28 LAB — HEPATIC FUNCTION PANEL
ALT: 162 U/L — AB (ref 17–63)
AST: 90 U/L — AB (ref 15–41)
Albumin: 4.2 g/dL (ref 3.5–5.0)
Alkaline Phosphatase: 45 U/L (ref 38–126)
BILIRUBIN TOTAL: 0.6 mg/dL (ref 0.3–1.2)
Total Protein: 6.9 g/dL (ref 6.5–8.1)

## 2015-11-28 LAB — HEPATITIS C ANTIBODY: HCV Ab: >11 s/co ratio — ABNORMAL HIGH (ref 0.0–0.9)

## 2015-11-28 LAB — HEMOGLOBIN A1C
Hgb A1c MFr Bld: 5.6 % (ref 4.8–5.6)
Mean Plasma Glucose: 114 mg/dL

## 2015-11-28 MED ORDER — TRAZODONE HCL 100 MG PO TABS
100.0000 mg | ORAL_TABLET | Freq: Every evening | ORAL | Status: DC | PRN
  Administered 2015-11-28: 100 mg via ORAL
  Filled 2015-11-28 (×2): qty 1

## 2015-11-28 NOTE — [facility] Group Notes (Signed)
[facility] LCSW Group Therapy  11/28/2015 10 - 10:55 AM  Type of Therapy:  Group Therapy  Participation Level:  Minimal  Participation Quality:  Sharing  Affect:  Flat  Cognitive:  Oriented  Insight:  Limited  Engagement in Therapy:  Limited  Modes of Intervention:  Activity, Discussion, Exploration, Socialization and Support  Summary of Progress/Problems: Summary of Progress/Problems: The main focus of today's process group was to identify the patient's current support system and decide on other supports that can be put in place. An emphasis was placed on using counselor, doctor, therapy groups, 12-step groups, and problem-specific support groups to expand supports. There was also an extensive discussion about what constitutes a healthy support versus an unhealthy support. Pt engaged easily during group session. As patients processed their anxiety about discharge and described healthy supports patient shared his anxiety related to new supports.  Patient chose a visual to represent decompensation as homelessness and improvement as nature.     Juan Robinson

## 2015-11-28 NOTE — Progress Notes (Signed)
D: Pt +ve AH denies SI/HI/VH. Pt is pleasant and cooperative. Pt major focus was AH, pt stated he was having issues with that and wanted to be knocked out to sleep. Pt was informed to discuss with the doctor in the morning a possible medication adjustment that may help with the AH. Pt appeared to be wanting medications, possible med seeking behaviors . Pt stated he was having a hard time talking to people about the voices and that's why he doesn't, because  It's embarrassing talking about it.   A: Pt was encouraged to talk to the doctor about his voices to see if there needs to be a medication adjustment.  Pt was offered support and encouragement. Pt was given scheduled medications. Pt was encourage to attend groups. Q 15 minute checks were done for safety.   R:Pt receptive to treatment and safety maintained on unit.

## 2015-11-28 NOTE — Progress Notes (Signed)
Juan Robinson cont to get stronger and healthier each day. He has done a good job of communicating his needs, asking about his medications and explaining his feelings. A he completed his daily assessment and on it he wrote he deneid SI today and he rated his depresision "09/05/08", respectively. R Safety in place.

## 2015-11-28 NOTE — Progress Notes (Signed)
[facility] MD Progress Note  11/28/2015 2:18 PM Juan Robinson  MRN:  [id number] Subjective:   Patient seen, chart reviewed and case discussed with nursing staff.   Patient states that he feels "fine."  He had insomnia last night. He wants to have Ativan for his anxiety. He reports some nausea and tremors today. He is aware of his hepatitis C.  He agrees to have follow-up appointment with his PCP. He denies SI. He declines to endorse about his AH/VH.  Principal Problem: Opioid dependence with withdrawal ([place]) Diagnosis:   Patient Active Problem List   Diagnosis Date Noted  . MDD (major depressive disorder), recurrent episode, severe ([place]) [F33.2] 11/26/2015  . Opioid dependence with withdrawal ([place]) [F11.23] 11/26/2015  . Substance induced mood disorder ([place]) [F19.94] 12/27/2014  . Polysubstance abuse [F19.10] 12/26/2014   Total Time spent with patient: 20 minutes  Past Psychiatric History: see HPI  Past Medical History:  Past Medical History:  Diagnosis Date  . Anxiety   . Panic attacks   . Polysubstance abuse     Past Surgical History:  Procedure Laterality Date  . HERNIA REPAIR     Family History: History reviewed. No pertinent family history. Family Psychiatric  History: see HPI Social History:  History  Alcohol Use  . Yes    Comment: drinks 4 days a week, "whatever i can get"     History  Drug Use  . Frequency: 7.0 times per week  . Types: IV, Cocaine, Marijuana, Methamphetamines, Heroin    Social History   Social History  . Marital status: Single    Spouse name: N/A  . Number of children: N/A  . Years of education: N/A   Social History Main Topics  . Smoking status: Never Smoker  . Smokeless tobacco: Never Used  . Alcohol use Yes     Comment: drinks 4 days a week, "whatever i can get"  . Drug use:     Frequency: 7.0 times per week    Types: IV, Cocaine, Marijuana, Methamphetamines, Heroin  . Sexual activity: Yes    Birth control/ protection: None    Other Topics Concern  . None   Social History Narrative  . None   Additional Social History:    Pain Medications: none Prescriptions: pt endorses abuse when he can get them Over the Counter: pt denies - see pta meds list History of alcohol / drug use?: Yes Longest period of sobriety (when/how long): unknown Negative Consequences of Use: Financial, Legal, Personal relationships Withdrawal Symptoms: Agitation, Cramps Name of Substance 1: allcohol 1 - Age of First Use: 40yo 1 - Amount (size/oz): AMAP 1 - Last Use / Amount: Monday Name of Substance 2: pot 2 - Age of First Use: 40yo 2 - Frequency: pt sts he uses drug regularly 2 - Last Use / Amount: [place] Name of Substance 3: methamphetamine 3 - Age of First Use: 10 3 - Frequency: pt sts uses cocaine regularly 3 - Last Use / Amount: most recent UDS + cocaine Name of Substance 4: cocaine            Sleep: Poor  Appetite:  Fair  Current Medications: Current Facility-Administered Medications  Medication Dose Route Frequency Provider Last Rate Last Dose  . acetaminophen (TYLENOL) tablet 650 mg  650 mg Oral Q6H PRN Ethelene Hal, NP      . alum & mag hydroxide-simeth (MAALOX/MYLANTA) 200-200-20 MG/5ML suspension 30 mL  30 mL Oral Q4H PRN Ethelene Hal, NP   30  mL at 11/27/15 0234  . cloNIDine (CATAPRES) tablet 0.1 mg  0.1 mg Oral BH-qamhs Linard Millers, MD       Followed by  . [START ON 11/30/2015] cloNIDine (CATAPRES) tablet 0.1 mg  0.1 mg Oral QAC breakfast Linard Millers, MD      . dicyclomine (BENTYL) tablet 20 mg  20 mg Oral Q6H PRN Linard Millers, MD   20 mg at 11/26/15 2126  . hydrOXYzine (ATARAX/VISTARIL) tablet 25 mg  25 mg Oral Q6H PRN Ethelene Hal, NP   25 mg at 11/28/15 1320  . Influenza vac split quadrivalent PF (FLUARIX) injection 0.5 mL  0.5 mL Intramuscular Tomorrow-1000 Rozetta Nunnery, NP      . loperamide (IMODIUM) capsule 2-4 mg  2-4 mg Oral PRN Linard Millers, MD      . LORazepam (ATIVAN) tablet 1 mg  1 mg Oral Q6H PRN Linard Millers, MD   1 mg at 11/28/15 1320  . LORazepam (ATIVAN) tablet 1 mg  1 mg Oral BID Linard Millers, MD   1 mg at 11/28/15 0817   Followed by  . [START ON 11/29/2015] LORazepam (ATIVAN) tablet 1 mg  1 mg Oral Daily Linard Millers, MD      . magnesium hydroxide (MILK OF MAGNESIA) suspension 30 mL  30 mL Oral Daily PRN Ethelene Hal, NP   30 mL at 11/28/15 0624  . methocarbamol (ROBAXIN) tablet 500 mg  500 mg Oral Q8H PRN Linard Millers, MD   500 mg at 11/26/15 2126  . multivitamin with minerals tablet 1 tablet  1 tablet Oral Daily Linard Millers, MD   1 tablet at 11/28/15 0815  . naproxen (NAPROSYN) tablet 500 mg  500 mg Oral BID PRN Linard Millers, MD   500 mg at 11/28/15 0093  . ondansetron (ZOFRAN-ODT) disintegrating tablet 4 mg  4 mg Oral Q6H PRN Linard Millers, MD      . QUEtiapine (SEROQUEL) tablet 100 mg  100 mg Oral QHS Norman Clay, MD   100 mg at 11/27/15 2126  . QUEtiapine (SEROQUEL) tablet 50 mg  50 mg Oral Q8H PRN Linard Millers, MD   50 mg at 11/26/15 2127  . QUEtiapine (SEROQUEL) tablet 50 mg  50 mg Oral Daily Norman Clay, MD   50 mg at 11/28/15 0816  . sertraline (ZOLOFT) tablet 25 mg  25 mg Oral Daily Norman Clay, MD   25 mg at 11/28/15 0815  . thiamine (VITAMIN B-1) tablet 100 mg  100 mg Oral Daily Linard Millers, MD   100 mg at 11/28/15 0815  . traZODone (DESYREL) tablet 100 mg  100 mg Oral QHS PRN Norman Clay, MD        Lab Results:  Results for orders placed or performed during the hospital encounter of 11/26/15 (from the past 48 hour(s))  Comprehensive metabolic panel     Status: Abnormal   Collection Time: 11/27/15  6:29 AM  Result Value Ref Range   Sodium 140 135 - 145 mmol/L   Potassium 3.8 3.5 - 5.1 mmol/L   Chloride 106 101 - 111 mmol/L   CO2 28 22 - 32 mmol/L   Glucose, Bld 141 (H) 65 - 99 mg/dL   BUN 10 6 - 20 mg/dL    Creatinine, Ser 1.12 0.61 - 1.24 mg/dL   Calcium 9.2 8.9 - 10.3 mg/dL   Total Protein 6.5 6.5 - 8.1 g/dL   Albumin  3.8 3.5 - 5.0 g/dL   AST 83 (H) 15 - 41 U/L   ALT 152 (H) 17 - 63 U/L   Alkaline Phosphatase 41 38 - 126 U/L   Total Bilirubin 0.6 0.3 - 1.2 mg/dL   GFR calc non Af Amer >60 >60 mL/min   GFR calc Af Amer >60 >60 mL/min    Comment: (NOTE) The eGFR has been calculated using the CKD EPI equation. This calculation has not been validated in all clinical situations. eGFR's persistently <60 mL/min signify possible Chronic Kidney Disease.    Anion gap 6 5 - 15    Comment: Performed at [facility]  CBC with Differential/Platelet     Status: Abnormal   Collection Time: 11/27/15  6:29 AM  Result Value Ref Range   WBC 6.1 4.0 - 10.5 K/uL   RBC 4.12 (L) 4.22 - 5.81 MIL/uL   Hemoglobin 13.1 13.0 - 17.0 g/dL   HCT 37.8 (L) 39.0 - 52.0 %   MCV 91.7 78.0 - 100.0 fL   MCH 31.8 26.0 - 34.0 pg   MCHC 34.7 30.0 - 36.0 g/dL   RDW 12.8 11.5 - 15.5 %   Platelets 226 150 - 400 K/uL   Neutrophils Relative % 64 %   Neutro Abs 4.0 1.7 - 7.7 K/uL   Lymphocytes Relative 23 %   Lymphs Abs 1.4 0.7 - 4.0 K/uL   Monocytes Relative 9 %   Monocytes Absolute 1.0 0.1 - 1.0 K/uL   Eosinophils Relative 4 %   Eosinophils Absolute 0.3 0.0 - 0.7 K/uL   Basophils Relative 0 %   Basophils Absolute 0 0.0 - 0.1 K/uL   WBC Morphology 0    RBC Morphology 0    Smear Review 0    nRBC 0 0 /100 WBC    Comment: Performed at [facility]  Hepatitis C antibody     Status: Abnormal   Collection Time: 11/27/15  6:29 AM  Result Value Ref Range   HCV Ab >11.0 (H) 0.0 - 0.9 s/co ratio    Comment: (NOTE)                                  Negative:     < 0.8                             Indeterminate: 0.8 - 0.9                                  Positive:     > 0.9 The CDC recommends that a positive HCV antibody result be followed up with a HCV Nucleic Acid Amplification test  (939030). Performed At: [facility] [place], [place] [id number] Lindon Romp MD [id number] Performed at [facility]   HIV antibody     Status: None   Collection Time: 11/27/15  6:29 AM  Result Value Ref Range   HIV Screen 4th Generation wRfx Non Reactive Non Reactive    Comment: (NOTE) Performed At: [facility] [place], [place] [id number] Lindon Romp MD [id number] Performed at [facility]   RPR     Status: None   Collection Time: 11/27/15  6:29 AM  Result Value Ref Range  RPR Ser Ql Non Reactive Non Reactive    Comment: (NOTE) Performed At: [facility] [place], [place] [id number] Lindon Romp MD [id number] Performed at [facility]   TSH     Status: None   Collection Time: 11/27/15  6:29 AM  Result Value Ref Range   TSH 1.055 0.350 - 4.500 uIU/mL    Comment: Performed at [facility]  Lipid panel     Status: Abnormal   Collection Time: 11/27/15  6:29 AM  Result Value Ref Range   Cholesterol 94 0 - 200 mg/dL   Triglycerides 103 <150 mg/dL   HDL 32 (L) >40 mg/dL   Total CHOL/HDL Ratio 2.9 RATIO   VLDL 21 0 - 40 mg/dL   LDL Cholesterol 41 0 - 99 mg/dL    Comment:        Total Cholesterol/HDL:CHD Risk Coronary Heart Disease Risk Table                     Men   Women  1/2 Average Risk   3.4   3.3  Average Risk       5.0   4.4  2 X Average Risk   9.6   7.1  3 X Average Risk  23.4   11.0        Use the calculated Patient Ratio above and the CHD Risk Table to determine the patient's CHD Risk.        ATP III CLASSIFICATION (LDL):  <100     mg/dL   Optimal  100-129  mg/dL   Near or Above                    Optimal  130-159  mg/dL   Borderline  160-189  mg/dL   High  >190     mg/dL   Very High Performed at [facility]   Hemoglobin A1c     Status: None   Collection Time: 11/27/15  6:29 AM   Result Value Ref Range   Hgb A1c MFr Bld 5.6 4.8 - 5.6 %    Comment: (NOTE)         Pre-diabetes: 5.7 - 6.4         Diabetes: >6.4         Glycemic control for adults with diabetes: <7.0    Mean Plasma Glucose 114 mg/dL    Comment: (NOTE) Performed At: [facility] [place], [place] [id number] Lindon Romp MD [id number] Performed at [facility]   Vitamin B12     Status: None   Collection Time: 11/27/15  6:29 AM  Result Value Ref Range   Vitamin B-12 506 180 - 914 pg/mL    Comment: (NOTE) This assay is not validated for testing neonatal or myeloproliferative syndrome specimens for Vitamin B12 levels. Performed at [facility]   Hepatic function panel     Status: Abnormal   Collection Time: 11/28/15  6:34 AM  Result Value Ref Range   Total Protein 6.9 6.5 - 8.1 g/dL   Albumin 4.2 3.5 - 5.0 g/dL   AST 90 (H) 15 - 41 U/L   ALT 162 (H) 17 - 63 U/L   Alkaline Phosphatase 45 38 - 126 U/L   Total Bilirubin 0.6 0.3 - 1.2 mg/dL   Bilirubin, Direct <0.1 (L) 0.1 - 0.5 mg/dL   Indirect Bilirubin NOT CALCULATED 0.3 - 0.9 mg/dL  Comment: Performed at [facility]    Blood Alcohol level:  Lab Results  Component Value Date   [facility] <5 12/26/2014   [facility]  09/25/2009    <5        LOWEST DETECTABLE LIMIT FOR SERUM ALCOHOL IS 5 mg/dL FOR MEDICAL PURPOSES ONLY    Metabolic Disorder Labs: Lab Results  Component Value Date   HGBA1C 5.6 11/27/2015   MPG 114 11/27/2015   No results found for: PROLACTIN Lab Results  Component Value Date   CHOL 94 11/27/2015   TRIG 103 11/27/2015   HDL 32 (L) 11/27/2015   CHOLHDL 2.9 11/27/2015   VLDL 21 11/27/2015   LDLCALC 41 11/27/2015    Physical Findings: AIMS: Facial and Oral Movements Muscles of Facial Expression: None, normal Lips and Perioral Area: None, normal Jaw: None, normal Tongue: None, normal,Extremity Movements Upper (arms, wrists, hands, fingers):  None, normal Lower (legs, knees, ankles, toes): None, normal, Trunk Movements Neck, shoulders, hips: None, normal, Overall Severity Severity of abnormal movements (highest score from questions above): None, normal Incapacitation due to abnormal movements: None, normal Patient's awareness of abnormal movements (rate only patient's report): No Awareness, Dental Status Current problems with teeth and/or dentures?: No Does patient usually wear dentures?: No  CIWA:  CIWA-Ar Total: 8 COWS:  COWS Total Score: 0  Musculoskeletal: Strength & Muscle Tone: within normal limits Gait & Station: normal Patient leans: N/A  Psychiatric Specialty Exam: Physical Exam  Review of Systems  Neurological: Positive for tremors.  Psychiatric/Behavioral: Positive for depression, hallucinations, substance abuse and suicidal ideas. The patient is nervous/anxious and has insomnia.   All other systems reviewed and are negative.   Blood pressure (!) 85/64, pulse 78, temperature 97.9 F (36.6 C), temperature source Oral, resp. rate 18, height _0  (1.803 m), weight 165 lb (74.8 kg), SpO2 97 %.Body mass index is 23.01 kg/m.  General Appearance: Fairly Groomed  Eye Contact:  Poor  Speech:  Clear and Coherent  Volume:  Decreased  Mood:  Depressed  Affect:  Blunt  Thought Process:  Coherent and Goal Directed  Orientation:  Full (Time, Place, and Person)  Thought Content:  Logical Perceptions: reports history of AH of hearing voices, VH of seeing a TV, unclear if he has CAH (declined to answer)  Suicidal Thoughts:  Yes.  without intent/plan  Homicidal Thoughts:  No  Memory:  Immediate;   Fair Recent;   Fair Remote;   Fair  Judgement:  Impaired  Insight:  Shallow  Psychomotor Activity:  Normal  Concentration:  Concentration: Fair and Attention Span: Fair  Recall:  [employer] of Knowledge:  Fair  Language:  Fair  Akathisia:  No  Handed:  Right  AIMS (if indicated):     Assets:  Communication  Skills Desire for Improvement  ADL's:  Intact  Cognition:  WNL  Sleep:  Number of Hours: 6.5   Assessment Juan Robinson is a 40 year old male with substance induced mood disorder, polysubstance use disorder (IV heroin, marijuana, meth, cocaine and alcohol), history of DT per chart, recent diagnosis of Hep C with recent presentation to ED for accidental overdose, admitted with SI after overdosing heroin, cocaine, methamphetamine. Psychosocial stressors including recent incarceration, unable to see his children, unemployment and death of his mother on 09-25-2015.   # MDD # bereavement # r/o substance induced mood disorder Patient continues to endorse passive SI and occasional psychotic symptoms. Will continue current medication. He is ruminative on Ativan; discussed the  risk of independence; encouraged patient to take hydroxyzine prn for anxiety.   # Poly substance use disorder (IV heroin, marijuana, meth, cocaine and alcohol) Patient has been tolerating clonidine and Ativan protocol without significant side effects. Will continue protocol.   # HepC Reviewed LFT; uptrending. He will need follow up appointment with PCP (he does not have insurance). Will continue to monitor LFT elevation.   Plan - Continue sertraline 25 mg daily - Continue quetiapine 50 mg daily, 100 mg qhs - Continue clonidine, Ativan protocol - Medication management to reduce current symptoms to base line and improve the patient's overall level of functioning. - Monitor for the adverse effect of the medications and anger outbursts - Continue 15 minutes observation for safety concerns - Encouraged to participate in milieu therapy and group therapy counseling sessions and also work with coping skills -  Develop treatment plan to decrease risk of relapse upon discharge and to reduce the need for readmission. -  Psycho-social education regarding relapse prevention and self care. - Health care follow up as needed for medical  problems. - Restart home medications where appropriate.  Treatment Plan Summary: Daily contact with patient to assess and evaluate symptoms and progress in treatment  Norman Clay, MD 11/28/2015, 2:18 PM

## 2015-11-29 MED ORDER — DOCUSATE SODIUM 100 MG PO CAPS
100.0000 mg | ORAL_CAPSULE | Freq: Every day | ORAL | Status: DC
  Administered 2015-11-29 – 2015-11-30 (×2): 100 mg via ORAL
  Filled 2015-11-29 (×5): qty 1

## 2015-11-29 MED ORDER — OLANZAPINE 5 MG PO TABS
5.0000 mg | ORAL_TABLET | Freq: Once | ORAL | Status: AC
  Administered 2015-11-29: 5 mg via ORAL
  Filled 2015-11-29: qty 1
  Filled 2015-11-29: qty 2

## 2015-11-29 MED ORDER — LORAZEPAM 1 MG PO TABS
1.0000 mg | ORAL_TABLET | Freq: Once | ORAL | Status: AC
  Administered 2015-11-29: 1 mg via ORAL
  Filled 2015-11-29: qty 1

## 2015-11-29 NOTE — Progress Notes (Signed)
Patient was complaining of constipation. Patient had milk of magnesium medication earlier with no relief. Patient was given orders from [name], [place]. Patient was given Colace just now and patient can have additional milk of magnesium in evening if does not have relief, will continue to monitor.

## 2015-11-29 NOTE — Progress Notes (Signed)
Recreation Therapy Notes  Date: 11/29/15 Time: 0930 Location: 300 Hall Dayroom  Group Topic: Stress Management  Goal Area(s) Addresses:  Patient will verbalize importance of using healthy stress management.  Patient will identify positive emotions associated with healthy stress management.   Behavioral Response: Engaged  Intervention: Calm App  Activity :  Body Scan Meditation.  LRT introduced the stress management technique of meditation.  LRT played a body scan meditation from the Calm app to allow the patients to engage in the activity.  Patients were to follow along as the meditation was being played.  Education:  Stress Management, Discharge Planning.   Education Outcome: Acknowledges edcuation/In group clarification offered/Needs additional education  Clinical Observations/Feedback: Pt attended group.   Ellyanna Holton, LRT/CTRS         Shaquitta Burbridge A 11/29/2015 12:09 PM 

## 2015-11-29 NOTE — Progress Notes (Addendum)
Patient stated that he "banged" head on door. Assessed patient's head for any hematomas, swelling, or bleeding. None was found. Patient stated that he was trying to bang head intentionally to "knock" himself out. Patient's left arm looked slightly twisted. Renata Capriceonrad, FNP assessed patient and stated that patient had good ROM. Patient was heard throwing items around in room. Patient stated that he wish could receive help but couldn't. Notified provider for orders. Orders received.

## 2015-11-29 NOTE — [facility] Group Notes (Signed)
[facility] LCSW Group Therapy  11/29/2015 4:01 PM  Type of Therapy:  Group Therapy  Participation Level:  Did Not Attend-pt invited. Chose to rest in room.   Summary of Progress/Problems: Today's Topic: Overcoming Obstacles. Patients identified one short term goal and potential obstacles in reaching this goal. Patients processed barriers involved in overcoming these obstacles. Patients identified steps necessary for overcoming these obstacles and explored motivation (internal and external) for facing these difficulties head on.   Juan Robinson 11/29/2015, 4:01 PM

## 2015-11-29 NOTE — Progress Notes (Signed)
Patient's clonidine held due to low BP.  BP 95/63 sitting; 78.55 standing.  Will take manually to reassess.

## 2015-11-29 NOTE — [facility] Suicide Risk Assessment (Signed)
[facility] INPATIENT:  Family/Significant Other Suicide Prevention Education  Suicide Prevention Education:  Contact Attempts: April MansonJason Prunty (pt's brother) [phone] has been identified by the patient as the family member/significant other with whom the patient will be residing, and identified as the person(s) who will aid the patient in the event of a mental health crisis.  With written consent from the patient, two attempts were made to provide suicide prevention education, prior to and/or following the patient's discharge.  We were unsuccessful in providing suicide prevention education.  A suicide education pamphlet was given to the patient to share with family/significant other.  Date and time of first attempt: 11/27/15 at 2:46PM (made by Santina Evansatherine C. Harrill, LCSW)-see previous Suicide prevention note.  Date and time of second attempt: 11/29/15 at 12:40PM (unable to leave voicemail).   Sangita Zani N Smart LCSW 11/29/2015, 12:41 PM

## 2015-11-29 NOTE — Tx Team (Signed)
Interdisciplinary Treatment and Diagnostic Plan Update  11/29/2015 Time of Session: 9:30AM Juan Robinson MRN: [id number]  Principal Diagnosis: Opioid dependence with withdrawal [facility])  Secondary Diagnoses: Principal Problem:   Opioid dependence with withdrawal (HCC) Active Problems:   MDD (major depressive disorder), recurrent episode, severe (HCC)   Current Medications:  Current Facility-Administered Medications  Medication Dose Route Frequency Provider Last Rate Last Dose  . acetaminophen (TYLENOL) tablet 650 mg  650 mg Oral Q6H PRN Laveda Abbe, NP      . alum & mag hydroxide-simeth (MAALOX/MYLANTA) 200-200-20 MG/5ML suspension 30 mL  30 mL Oral Q4H PRN Laveda Abbe, NP   30 mL at 11/27/15 0234  . cloNIDine (CATAPRES) tablet 0.1 mg  0.1 mg Oral BH-qamhs Acquanetta Sit, MD   0.1 mg at 11/28/15 2110   Followed by  . [START ON 11/30/2015] cloNIDine (CATAPRES) tablet 0.1 mg  0.1 mg Oral QAC breakfast Acquanetta Sit, MD      . dicyclomine (BENTYL) tablet 20 mg  20 mg Oral Q6H PRN Acquanetta Sit, MD   20 mg at 11/26/15 2126  . hydrOXYzine (ATARAX/VISTARIL) tablet 25 mg  25 mg Oral Q6H PRN Laveda Abbe, NP   25 mg at 11/28/15 2109  . loperamide (IMODIUM) capsule 2-4 mg  2-4 mg Oral PRN Acquanetta Sit, MD      . LORazepam (ATIVAN) tablet 1 mg  1 mg Oral Q6H PRN Acquanetta Sit, MD   1 mg at 11/28/15 2333  . magnesium hydroxide (MILK OF MAGNESIA) suspension 30 mL  30 mL Oral Daily PRN Laveda Abbe, NP   30 mL at 11/28/15 0624  . methocarbamol (ROBAXIN) tablet 500 mg  500 mg Oral Q8H PRN Acquanetta Sit, MD   500 mg at 11/26/15 2126  . multivitamin with minerals tablet 1 tablet  1 tablet Oral Daily Acquanetta Sit, MD   1 tablet at 11/29/15 0827  . naproxen (NAPROSYN) tablet 500 mg  500 mg Oral BID PRN Acquanetta Sit, MD   500 mg at 11/28/15 4098  . ondansetron (ZOFRAN-ODT) disintegrating tablet 4 mg  4 mg  Oral Q6H PRN Acquanetta Sit, MD      . QUEtiapine (SEROQUEL) tablet 100 mg  100 mg Oral QHS Neysa Hotter, MD   100 mg at 11/28/15 2112  . QUEtiapine (SEROQUEL) tablet 50 mg  50 mg Oral Q8H PRN Acquanetta Sit, MD   50 mg at 11/28/15 2033  . QUEtiapine (SEROQUEL) tablet 50 mg  50 mg Oral Daily Neysa Hotter, MD   50 mg at 11/29/15 0827  . sertraline (ZOLOFT) tablet 25 mg  25 mg Oral Daily Neysa Hotter, MD   25 mg at 11/29/15 0827  . thiamine (VITAMIN B-1) tablet 100 mg  100 mg Oral Daily Acquanetta Sit, MD   100 mg at 11/29/15 0827  . traZODone (DESYREL) tablet 100 mg  100 mg Oral QHS PRN Neysa Hotter, MD   100 mg at 11/28/15 2110   PTA Medications: Prescriptions Prior to Admission  Medication Sig Dispense Refill Last Dose  . amphetamine-dextroamphetamine (ADDERALL) 30 MG tablet Take 30 mg by mouth 2 (two) times daily.    Unknown at Unknown time  . buPROPion (WELLBUTRIN SR) 100 MG 12 hr tablet Take 1 tablet (100 mg total) by mouth daily. (Patient not taking: Reported on 11/26/2015)   Unknown at Unknown time  . buPROPion (WELLBUTRIN SR) 150 MG 12 hr tablet Take 150 mg  by mouth 2 (two) times daily.   Unknown at Unknown time  . cloNIDine (CATAPRES) 0.1 MG tablet Please take one tablet of Clonidine 0.1 mg every six hours as needed for symptoms of opiate withdrawal for the next two days. Patient reported having this medication at home to help with detox symptoms after discharge from the Observation Unit. (Patient not taking: Reported on 11/26/2015) 60 tablet 11 Unknown at Unknown time  . cyclobenzaprine (FLEXERIL) 10 MG tablet Take 10-20 mg by mouth at bedtime as needed for muscle spasms.    Unknown at Unknown time  . diazepam (VALIUM) 10 MG tablet Take 10 mg by mouth every 6 (six) hours as needed for anxiety.   Unknown at Unknown time  . dicyclomine (BENTYL) 20 MG tablet Take 1 tablet (20 mg total) by mouth every 6 (six) hours as needed for spasms (abdominal cramping). (Patient not  taking: Reported on 11/26/2015) 10 tablet 0 Unknown at Unknown time  . gabapentin (NEURONTIN) 600 MG tablet Take 1 tablet (600 mg total) by mouth 2 (two) times daily. 28 tablet 0 Unknown at Unknown time  . Ibuprofen-Diphenhydramine Cit (ADVIL PM PO) Take 1 tablet by mouth at bedtime as needed. For sleep   Unknown at Unknown time  . methocarbamol (ROBAXIN) 500 MG tablet Take 1 tablet (500 mg total) by mouth every 8 (eight) hours as needed for muscle spasms. (Patient not taking: Reported on 11/26/2015) 10 tablet 0 Unknown at Unknown time  . Multiple Vitamin (MULTIVITAMIN WITH MINERALS) TABS tablet Take 1 tablet by mouth daily. (Patient not taking: Reported on 11/26/2015)   Unknown at Unknown time  . ondansetron (ZOFRAN-ODT) 4 MG disintegrating tablet Take 1 tablet (4 mg total) by mouth every 6 (six) hours as needed for nausea or vomiting. (Patient not taking: Reported on 11/26/2015) 10 tablet 0 Unknown at Unknown time  . QUEtiapine (SEROQUEL) 100 MG tablet Take 100 mg by mouth at bedtime.   Unknown at Unknown time  . QUEtiapine (SEROQUEL) 50 MG tablet Take 1 tablet (50 mg total) by mouth at bedtime. (Patient not taking: Reported on 11/26/2015)   Unknown at Unknown time  . thiamine 100 MG tablet Take 1 tablet (100 mg total) by mouth daily. (Patient not taking: Reported on 11/26/2015)   Unknown at Unknown time    Patient Stressors: Educational concerns Financial difficulties Health problems Legal issue  Patient Strengths: Ability for insight Active sense of humor Average or above average intelligence Capable of independent living  Treatment Modalities: Medication Management, Group therapy, Case management,  1 to 1 session with clinician, Psychoeducation, Recreational therapy.   Physician Treatment Plan for Primary Diagnosis: Opioid dependence with withdrawal (HCC) Long Term Goal(s): Improvement in symptoms so as ready for discharge Improvement in symptoms so as ready for discharge   Short  Term Goals: Ability to demonstrate self-control will improve Ability to maintain clinical measurements within normal limits will improve Ability to identify changes in lifestyle to reduce recurrence of condition will improve  Medication Management: Evaluate patient's response, side effects, and tolerance of medication regimen.  Therapeutic Interventions: 1 to 1 sessions, Unit Group sessions and Medication administration.  Evaluation of Outcomes: Progressing  Physician Treatment Plan for Secondary Diagnosis: Principal Problem:   Opioid dependence with withdrawal (HCC) Active Problems:   MDD (major depressive disorder), recurrent episode, severe (HCC)  Long Term Goal(s): Improvement in symptoms so as ready for discharge Improvement in symptoms so as ready for discharge   Short Term Goals: Ability to demonstrate self-control will  improve Ability to maintain clinical measurements within normal limits will improve Ability to identify changes in lifestyle to reduce recurrence of condition will improve     Medication Management: Evaluate patient's response, side effects, and tolerance of medication regimen.  Therapeutic Interventions: 1 to 1 sessions, Unit Group sessions and Medication administration.  Evaluation of Outcomes: Progressing   RN Treatment Plan for Primary Diagnosis: Opioid dependence with withdrawal (HCC) Long Term Goal(s): Knowledge of disease and therapeutic regimen to maintain health will improve  Short Term Goals: Ability to remain free from injury will improve, Ability to disclose and discuss suicidal ideas and Ability to identify and develop effective coping behaviors will improve  Medication Management: RN will administer medications as ordered by provider, will assess and evaluate patient's response and provide education to patient for prescribed medication. RN will report any adverse and/or side effects to prescribing provider.  Therapeutic Interventions: 1 on 1  counseling sessions, Psychoeducation, Medication administration, Evaluate responses to treatment, Monitor vital signs and CBGs as ordered, Perform/monitor CIWA, COWS, AIMS and Fall Risk screenings as ordered, Perform wound care treatments as ordered.  Evaluation of Outcomes: Progressing   LCSW Treatment Plan for Primary Diagnosis: Opioid dependence with withdrawal (HCC) Long Term Goal(s): Safe transition to appropriate next level of care at discharge, Engage patient in therapeutic group addressing interpersonal concerns.  Short Term Goals: Engage patient in aftercare planning with referrals and resources, Facilitate patient progression through stages of change regarding substance use diagnoses and concerns and Identify triggers associated with mental health/substance abuse issues  Therapeutic Interventions: Assess for all discharge needs, 1 to 1 time with Social worker, Explore available resources and support systems, Assess for adequacy in community support network, Educate family and significant other(s) on suicide prevention, Complete Psychosocial Assessment, Interpersonal group therapy.  Evaluation of Outcomes: Progressing   Progress in Treatment: Attending groups: Yes. Participating in groups: Yes. Taking medication as prescribed: Yes. Toleration medication: Yes. Family/Significant other contact made: Yes, individual(s) contacted:  SPE attempted with pt's brother. CSW continuing to attempt to reach pt's brother for SPE. Patient understands diagnosis: Yes. Discussing patient identified problems/goals with staff: Yes. Medical problems stabilized or resolved: Yes. Denies suicidal/homicidal ideation: Yes. Issues/concerns per patient self-inventory: No. Other: n.a  New problem(s) identified: No, Describe:  n/a  New Short Term/Long Term Goal(s): referral for inpatient treatment; medication stabilization.   Discharge Plan or Barriers: CSW assessing for appropriate referrals. Pt  expressed interested in inpatient treatment.   Reason for Continuation of Hospitalization: Depression Medication stabilization Withdrawal symptoms  Estimated Length of Stay: 3-5 days   Attendees: Patient: 11/29/2015 9:17 AM  Physician: Dr. Mckinley Jewelates MD 11/29/2015 9:17 AM  Nursing: Holley DexterJoy, Beverly RN 11/29/2015 9:17 AM  RN Care Manager: Onnie BoerJennifer Clark CM 11/29/2015 9:17 AM  Social Worker: Trula SladeHeather Smart, LCSW 11/29/2015 9:17 AM  Recreational Therapist:  11/29/2015 9:17 AM  Other:  11/29/2015 9:17 AM  Other:  11/29/2015 9:17 AM  Other: 11/29/2015 9:17 AM    Scribe for Treatment Team: Ledell PeoplesHeather N Smart, LCSW 11/29/2015 9:17 AM

## 2015-11-29 NOTE — Progress Notes (Signed)
D: Pt denies SI/HI/AVH. Pt is pleasant and cooperative. Pt became visibly agitated when he was informed that his clonidine would be held tonight due to his low Bp  99/64 on first check. Pt was given fluids and re check was 106/66. Pt was given Seroquel and vistaril. Pt up on the milieu , pt was sleep when we arrived but woke up to tak his medications.   A: Pt was offered support and encouragement. Pt was given scheduled medications. Pt was encourage to attend groups. Q 15 minute checks were done for safety.   R:Pt receptive to treatment and safety maintained on unit.

## 2015-11-29 NOTE — B[facility] Group Notes (Signed)
Pt attended spiritual care group on grief and loss facilitated by chaplain Burnis KingfisherMatthew Laurie Penado   Group opened with brief discussion and psycho-social ed around grief and loss in relationships and in relation to self - identifying life patterns, circumstances, changes that cause losses. Established group norm of speaking from own life experience. Group goal of establishing open and affirming space for members to share loss and experience with grief, normalize grief experience and provide psycho social education and grief support.   Juan Robinson attended group on grief and loss.  Left group voluntarily 3/4 of way through group.  Juan Robinson did not engage in group verbally.

## 2015-11-29 NOTE — Progress Notes (Signed)
Reported [facility] by [facility], that physician had concerns of patient sitting in bed with head down. Checked on patient and patient stated that he was ok and did not need anything. Just reported by Rayfield Citizenaroline, RN that she heard loud noise and patient stated that [place] fell. Fraser Dinreston stated that he did not fall, but he felt like he was going out of his mind. Notified provider.

## 2015-11-29 NOTE — Progress Notes (Signed)
[facility]  11/29/2015 1:45 PM  Patient Active Problem List   Diagnosis Date Noted  . MDD (major depressive disorder), recurrent episode, severe (HCC) 11/26/2015  . Opioid dependence with withdrawal (HCC) 11/26/2015  . Substance induced mood disorder (HCC) 12/27/2014  . Polysubstance abuse 12/26/2014    Diagnosis: Multiple substance use disorders  Subjective: Patient patient complains that he doesn't understand why "they" took him off his Valium. Educated patient that day was actually May and that he had been taken off as we discussed because this is not a safe medicine for somebody with active and severe substance use disorder to be using. Patient complained of auditory hallucinations over the weekend and also says that this is something that has bothered him to me and he has been started on Seroquel and appears to be tolerating it well. He is also been started on Zoloft for anxiety and depression and appears to be tolerating this well. Patient denies any acute suicidal or homicidal ideation, plan or intent and he is not sure how long he wants to stay. He was offered to sign in as a voluntary patient and he was a little bit confused about the processed until it was explained to him that if he was interested in discharge this was the first step after which he readily signed. He stated "I sort of feel like somebody is trying to trick me but that's just  my paranoia."  Objective: Well-developed well-nourished male in no apparent distress neatly groomed and dressed and appears physically without signs and symptoms of withdrawal speech and motor within normal limits mood is described as anxious, affect is mildly anxious and slightly confused, thought processes are overall linear and goal-directed thought content endorses some chronic problems with auditory hallucinations denies current acute suicidal or homicidal ideation, plan or intent off by 1 on date but oriented to person and time and  place otherwise insight and judgment are limited IQ appears an average range but he appears also to have some processing deficits that may be related to substance use issues     Current Facility-Administered Medications (Cardiovascular):  .  cloNIDine (CATAPRES) tablet 0.1 mg **FOLLOWED BY** cloNIDine (CATAPRES) tablet 0.1 mg **FOLLOWED BY** [START ON 11/30/2015] cloNIDine (CATAPRES) tablet 0.1 mg     Current Facility-Administered Medications (Analgesics):  .  acetaminophen (TYLENOL) tablet 650 mg .  naproxen (NAPROSYN) tablet 500 mg     Current Facility-Administered Medications (Other):  .  alum & mag hydroxide-simeth (MAALOX/MYLANTA) 200-200-20 MG/5ML suspension 30 mL .  dicyclomine (BENTYL) tablet 20 mg .  hydrOXYzine (ATARAX/VISTARIL) tablet 25 mg .  loperamide (IMODIUM) capsule 2-4 mg .  LORazepam (ATIVAN) tablet 1 mg .  magnesium hydroxide (MILK OF MAGNESIA) suspension 30 mL .  methocarbamol (ROBAXIN) tablet 500 mg .  multivitamin with minerals tablet 1 tablet .  ondansetron (ZOFRAN-ODT) disintegrating tablet 4 mg .  QUEtiapine (SEROQUEL) tablet 100 mg .  QUEtiapine (SEROQUEL) tablet 50 mg .  QUEtiapine (SEROQUEL) tablet 50 mg .  sertraline (ZOLOFT) tablet 25 mg .  thiamine (VITAMIN B-1) tablet 100 mg .  traZODone (DESYREL) tablet 100 mg  No current outpatient prescriptions on file.  Vital Signs:Blood pressure 111/77, pulse (!) 103, temperature 97.6 F (36.4 C), temperature source Oral, resp. rate 18, height 5\' 11"  (1.803 m), weight 74.8 kg (165 lb), SpO2 97 %.    Lab Results:  Results for orders placed or performed during the hospital encounter of 11/26/15 (from the past 48 hour(s))  Hepatic  function panel     Status: Abnormal   Collection Time: 11/28/15  6:34 AM  Result Value Ref Range   Total Protein 6.9 6.5 - 8.1 g/dL   Albumin 4.2 3.5 - 5.0 g/dL   AST 90 (H) 15 - 41 U/L   ALT 162 (H) 17 - 63 U/L   Alkaline Phosphatase 45 38 - 126 U/L   Total Bilirubin  0.6 0.3 - 1.2 mg/dL   Bilirubin, Direct <8.1<0.1 (L) 0.1 - 0.5 mg/dL   Indirect Bilirubin NOT CALCULATED 0.3 - 0.9 mg/dL    Comment[facility]    Physical Findings: AIMS: Facial and Oral Movements Muscles of Facial Expression: None, normal Lips and Perioral Area: None, normal Jaw: None, normal Tongue: None, normal,Extremity Movements Upper (arms, wrists, hands, fingers): None, normal Lower (legs, knees, ankles, toes): None, normal, Trunk Movements Neck, shoulders, hips: None, normal, Overall Severity Severity of abnormal movements (highest score from questions above): None, normal Incapacitation due to abnormal movements: None, normal Patient's awareness of abnormal movements (rate only patient's report): No Awareness, Dental Status Current problems with teeth and/or dentures?: No Does patient usually wear dentures?: No  CIWA:  CIWA-Ar Total: 1 COWS:  COWS Total Score: 5   Assessment/Plan: Patient with some difficulty clarifying for himself what he wants to get out of the hospitalization and what he wants to do in terms of his substance use disorder. However he has not signed himself out, despite being informed that he would not receive controlled substances here. Social work will continue to work with him to see if he can be referred to any further programming for treatment of substance use disorder. It appears that the Seroquel and Zoloft could be helpful for the patient and they will continue. We will continue to monitor patient's progress and interest in staying in treatment and adjust our plan accordingly.  Acquanetta SitElizabeth Woods Alette Kataoka, MD 11/29/2015, 1:45 PM

## 2015-11-29 NOTE — Progress Notes (Signed)
Patient stated that he was depressed. Patient was in room at time of assessment. Patient stated that he was feeling depressed because of the voices he was hearing. Patient denies SI/HI/VH.   Patient remains safe on unit with q 15 min checks. Patient offered support, education, and encouragement.   Patient is receptive and compliant, will continue to monitor.

## 2015-11-30 MED ORDER — THIAMINE HCL 100 MG PO TABS: 100.0000 mg | ORAL_TABLET | Freq: Every day | ORAL | 0 refills | Status: AC

## 2015-11-30 MED ORDER — SERTRALINE HCL 25 MG PO TABS: 25.0000 mg | ORAL_TABLET | Freq: Every day | ORAL | 0 refills | Status: AC

## 2015-11-30 MED ORDER — QUETIAPINE FUMARATE 100 MG PO TABS: 100.0000 mg | ORAL_TABLET | Freq: Every day | ORAL | 0 refills | Status: AC

## 2015-11-30 MED ORDER — QUETIAPINE FUMARATE 50 MG PO TABS: 50.0000 mg | ORAL_TABLET | Freq: Every day | ORAL | 0 refills | Status: AC

## 2015-11-30 MED ORDER — HYDROXYZINE HCL 25 MG PO TABS: 25.0000 mg | ORAL_TABLET | Freq: Four times a day (QID) | ORAL | 0 refills | Status: AC | PRN

## 2015-11-30 MED ORDER — TRAZODONE HCL 100 MG PO TABS: 100.0000 mg | ORAL_TABLET | Freq: Every evening | ORAL | 0 refills | Status: AC | PRN
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
## 2015-11-30 NOTE — Tx Team (Signed)
Interdisciplinary Treatment and Diagnostic Plan Update  11/30/2015 Time of Session: 9:30AM Juan Robinson MRN: [id number]  Principal Diagnosis: Opioid dependence with withdrawal [facility])  Secondary Diagnoses: Principal Problem:   Opioid dependence with withdrawal ([place]) Active Problems:   MDD (major depressive disorder), recurrent episode, severe ([place])   Current Medications:  Current Facility-Administered Medications  Medication Dose Route Frequency Provider Last Rate Last Dose  . acetaminophen (TYLENOL) tablet 650 mg  650 mg Oral Q6H PRN Ethelene Hal, NP      . alum & mag hydroxide-simeth (MAALOX/MYLANTA) 200-200-20 MG/5ML suspension 30 mL  30 mL Oral Q4H PRN Ethelene Hal, NP   30 mL at 11/29/15 1758  . cloNIDine (CATAPRES) tablet 0.1 mg  0.1 mg Oral QAC breakfast Linard Millers, MD   0.1 mg at 11/30/15 2707  . dicyclomine (BENTYL) tablet 20 mg  20 mg Oral Q6H PRN Linard Millers, MD   20 mg at 11/26/15 2126  . docusate sodium (COLACE) capsule 100 mg  100 mg Oral Daily Benjamine Mola, FNP   100 mg at 11/30/15 8675  . hydrOXYzine (ATARAX/VISTARIL) tablet 25 mg  25 mg Oral Q6H PRN Ethelene Hal, NP   25 mg at 11/29/15 2154  . magnesium hydroxide (MILK OF MAGNESIA) suspension 30 mL  30 mL Oral Daily PRN Ethelene Hal, NP   30 mL at 11/29/15 1101  . methocarbamol (ROBAXIN) tablet 500 mg  500 mg Oral Q8H PRN Linard Millers, MD   500 mg at 11/26/15 2126  . multivitamin with minerals tablet 1 tablet  1 tablet Oral Daily Linard Millers, MD   1 tablet at 11/30/15 0820  . naproxen (NAPROSYN) tablet 500 mg  500 mg Oral BID PRN Linard Millers, MD   500 mg at 11/28/15 4492  . QUEtiapine (SEROQUEL) tablet 100 mg  100 mg Oral QHS Norman Clay, MD   100 mg at 11/29/15 2154  . QUEtiapine (SEROQUEL) tablet 50 mg  50 mg Oral Q8H PRN Linard Millers, MD   50 mg at 11/28/15 2033  . QUEtiapine (SEROQUEL) tablet 50 mg  50 mg Oral Daily Norman Clay, MD   50 mg at 11/30/15 0820  . sertraline (ZOLOFT) tablet 25 mg  25 mg Oral Daily Norman Clay, MD   25 mg at 11/30/15 0820  . thiamine (VITAMIN B-1) tablet 100 mg  100 mg Oral Daily Linard Millers, MD   100 mg at 11/30/15 0820  . traZODone (DESYREL) tablet 100 mg  100 mg Oral QHS PRN Norman Clay, MD   100 mg at 11/28/15 2110   PTA Medications: Prescriptions Prior to Admission  Medication Sig Dispense Refill Last Dose  . amphetamine-dextroamphetamine (ADDERALL) 30 MG tablet Take 30 mg by mouth 2 (two) times daily.    Unknown at Unknown time  . buPROPion (WELLBUTRIN SR) 100 MG 12 hr tablet Take 1 tablet (100 mg total) by mouth daily. (Patient not taking: Reported on 11/26/2015)   Unknown at Unknown time  . buPROPion (WELLBUTRIN SR) 150 MG 12 hr tablet Take 150 mg by mouth 2 (two) times daily.   Unknown at Unknown time  . cloNIDine (CATAPRES) 0.1 MG tablet Please take one tablet of Clonidine 0.1 mg every six hours as needed for symptoms of opiate withdrawal for the next two days. Patient reported having this medication at home to help with detox symptoms after discharge from the Observation Unit. (Patient not taking: Reported on 11/26/2015) 60 tablet 11  Unknown at Unknown time  . cyclobenzaprine (FLEXERIL) 10 MG tablet Take 10-20 mg by mouth at bedtime as needed for muscle spasms.    Unknown at Unknown time  . diazepam (VALIUM) 10 MG tablet Take 10 mg by mouth every 6 (six) hours as needed for anxiety.   Unknown at Unknown time  . dicyclomine (BENTYL) 20 MG tablet Take 1 tablet (20 mg total) by mouth every 6 (six) hours as needed for spasms (abdominal cramping). (Patient not taking: Reported on 11/26/2015) 10 tablet 0 Unknown at Unknown time  . gabapentin (NEURONTIN) 600 MG tablet Take 1 tablet (600 mg total) by mouth 2 (two) times daily. 28 tablet 0 Unknown at Unknown time  . Ibuprofen-Diphenhydramine Cit (ADVIL PM PO) Take 1 tablet by mouth at bedtime as needed. For sleep   Unknown at  Unknown time  . methocarbamol (ROBAXIN) 500 MG tablet Take 1 tablet (500 mg total) by mouth every 8 (eight) hours as needed for muscle spasms. (Patient not taking: Reported on 11/26/2015) 10 tablet 0 Unknown at Unknown time  . Multiple Vitamin (MULTIVITAMIN WITH MINERALS) TABS tablet Take 1 tablet by mouth daily. (Patient not taking: Reported on 11/26/2015)   Unknown at Unknown time  . ondansetron (ZOFRAN-ODT) 4 MG disintegrating tablet Take 1 tablet (4 mg total) by mouth every 6 (six) hours as needed for nausea or vomiting. (Patient not taking: Reported on 11/26/2015) 10 tablet 0 Unknown at Unknown time  . QUEtiapine (SEROQUEL) 100 MG tablet Take 100 mg by mouth at bedtime.   Unknown at Unknown time  . QUEtiapine (SEROQUEL) 50 MG tablet Take 1 tablet (50 mg total) by mouth at bedtime. (Patient not taking: Reported on 11/26/2015)   Unknown at Unknown time  . thiamine 100 MG tablet Take 1 tablet (100 mg total) by mouth daily. (Patient not taking: Reported on 11/26/2015)   Unknown at Unknown time    Patient Stressors: Educational concerns Financial difficulties Health problems Legal issue  Patient Strengths: Ability for insight Active sense of humor Average or above average intelligence Capable of independent living  Treatment Modalities: Medication Management, Group therapy, Case management,  1 to 1 session with clinician, Psychoeducation, Recreational therapy.   Physician Treatment Plan for Primary Diagnosis: Opioid dependence with withdrawal ([place]) Long Term Goal(s): Improvement in symptoms so as ready for discharge Improvement in symptoms so as ready for discharge   Short Term Goals: Ability to demonstrate self-control will improve Ability to maintain clinical measurements within normal limits will improve Ability to identify changes in lifestyle to reduce recurrence of condition will improve  Medication Management: Evaluate patient's response, side effects, and tolerance of  medication regimen.  Therapeutic Interventions: 1 to 1 sessions, Unit Group sessions and Medication administration.  Evaluation of Outcomes: Met  Physician Treatment Plan for Secondary Diagnosis: Principal Problem:   Opioid dependence with withdrawal ([place]) Active Problems:   MDD (major depressive disorder), recurrent episode, severe ([place])  Long Term Goal(s): Improvement in symptoms so as ready for discharge Improvement in symptoms so as ready for discharge   Short Term Goals: Ability to demonstrate self-control will improve Ability to maintain clinical measurements within normal limits will improve Ability to identify changes in lifestyle to reduce recurrence of condition will improve     Medication Management: Evaluate patient's response, side effects, and tolerance of medication regimen.  Therapeutic Interventions: 1 to 1 sessions, Unit Group sessions and Medication administration.  Evaluation of Outcomes: Met   RN Treatment Plan for Primary Diagnosis: Opioid dependence with  withdrawal ([place]) Long Term Goal(s): Knowledge of disease and therapeutic regimen to maintain health will improve  Short Term Goals: Ability to remain free from injury will improve, Ability to disclose and discuss suicidal ideas and Ability to identify and develop effective coping behaviors will improve  Medication Management: RN will administer medications as ordered by provider, will assess and evaluate patient's response and provide education to patient for prescribed medication. RN will report any adverse and/or side effects to prescribing provider.  Therapeutic Interventions: 1 on 1 counseling sessions, Psychoeducation, Medication administration, Evaluate responses to treatment, Monitor vital signs and CBGs as ordered, Perform/monitor CIWA, COWS, AIMS and Fall Risk screenings as ordered, Perform wound care treatments as ordered.  Evaluation of Outcomes: Met   LCSW Treatment Plan for Primary Diagnosis:  Opioid dependence with withdrawal ([place]) Long Term Goal(s): Safe transition to appropriate next level of care at discharge, Engage patient in therapeutic group addressing interpersonal concerns.  Short Term Goals: Engage patient in aftercare planning with referrals and resources, Facilitate patient progression through stages of change regarding substance use diagnoses and concerns and Identify triggers associated with mental health/substance abuse issues  Therapeutic Interventions: Assess for all discharge needs, 1 to 1 time with Social worker, Explore available resources and support systems, Assess for adequacy in community support network, Educate family and significant other(s) on suicide prevention, Complete Psychosocial Assessment, Interpersonal group therapy.  Evaluation of Outcomes: Met   Progress in Treatment: Attending groups: Yes. Participating in groups: Yes. Taking medication as prescribed: Yes. Toleration medication: Yes. Family/Significant other contact made: Yes, individual(s) contacted:  SPE attempted with pt's brother. SPE completed with pt Discussing patient identified problems/goals with staff: Yes. Medical problems stabilized or resolved: Yes. Denies suicidal/homicidal ideation: Yes. Issues/concerns per patient self-inventory: No. Other: n.a  New problem(s) identified: No, Describe:  n/a  New Short Term/Long Term Goal(s): referral for inpatient treatment; medication stabilization.   Discharge Plan or Barriers: return home; [facility] for follow-up per pt request.   Reason for Continuation of Hospitalization: none  Estimated Length of Stay: d/c today  Attendees: Patient: 11/30/2015 1:10 PM  Physician: Dr. Sharolyn Douglas MD 11/30/2015 1:10 PM  Nursing: Windy Fast RN 11/30/2015 1:10 PM  RN Care Manager: Lars Pinks CM 11/30/2015 1:10 PM  Social Worker: Press photographer, LCSW 11/30/2015 1:10 PM  Recreational Therapist:  11/30/2015 1:10 PM  Other:  11/30/2015 1:10  PM  Other:  11/30/2015 1:10 PM  Other: 11/30/2015 1:10 PM    Scribe for Treatment Team: Kimber Relic Smart, LCSW 11/30/2015 1:10 PM

## 2015-11-30 NOTE — Discharge Summary (Signed)
Physician Discharge Summary Note  Patient:  Juan Robinson is an 40 y.o., male MRN:  [id number] DOB:  [birth year] Patient phone:  [phone] (home)  Patient address:   [address] [place] [address],  Total Time spent with patient: 45 minutes  Date of Admission:  11/26/2015 Date of Discharge: 11/30/2015   Reason for Admission:   Patient apparently presented to the ER initially on 11/23/15 but then stated he only taken an accidental overdose and was okay to leave. He apparently returned 2 days later with similar complaints of wanting to overdose and suicidal ideation at this time did state to be admitted. Patient reports that he is not feeling well right now secondary to withdrawal symptoms. He reports that he is "better about that" now in terms of any suicidality and denies any current plan or intent to act he does admit that chronically he has intermittent suicidal thoughts. He denies any ideation, plan or intent to harm others. He denies current psychotic symptoms but does report he has heard voices during withdrawal in the past. He states that in the past he has been given Thorazine for the voices and it was effective. He reports his last use of opiates was Monday a.m. and heroin however and his last use of alcohol was Tuesday. But then says he's not sure it might be Tuesday and Wednesday because "my days are all mixed up." Patient reports I can't understand why they don't put me on the medications that work for me" and names Valium, Adderall, Flexeril and Wellbutrin. Patient was educated that Valium and Adderall would be contraindicated in someone with a significant substance use disorder. We can address trying Wellbutrin once he is through with his withdrawals. Patient reports he believes he is here on an involuntary commitment and when asked if he wanted to sign in as a voluntary patient he said no because he was afraid he would simply leave as he started to feel worse. Patient is not  very engaged historian secondary to withdrawal symptoms but he does admit to using IV heroin marijuana, meth, cocaine and alcohol. In the past year or so he has broken up with a fianc, lost jobs and become homeless. Chart reports past diagnosis isn't bipolar, anxiety and ADHD and that he has not been compliant with follow-up.  Principal Problem: Opioid dependence with withdrawal [facility]) Discharge Diagnoses: Patient Active Problem List   Diagnosis Date Noted  . MDD (major depressive disorder), recurrent episode, severe (H[facility]) [F33.2] 11/26/2015  . Opioid dependence with withdrawal ([facility]) [F11.23] 11/26/2015  . Substance induced mood disorder ([facility]) [F19.94] 12/27/2014  . Polysubstance abuse [F19.10] 12/26/2014    Past Psychiatric History: See Robinson&P  Past Medical History:  Past Medical History:  Diagnosis Date  . Anxiety   . Panic attacks   . Polysubstance abuse     Past Surgical History:  Procedure Laterality Date  . HERNIA REPAIR     Family History: History reviewed. No pertinent family history. Family Psychiatric  History: See Robinson&P Social History:  History  Alcohol Use  . Yes    Comment: drinks 4 days a week, "whatever i can get"     History  Drug Use  . Frequency: 7.0 times per week  . Types: IV, Cocaine, Marijuana, Methamphetamines, Heroin    Social History   Social History  . Marital status: Single    Spouse name: N/A  . Number of children: N/A  . Years of education: N/A   Social History Main Topics  .  Smoking status: Never Smoker  . Smokeless tobacco: Never Used  . Alcohol use Yes     Comment: drinks 4 days a week, "whatever i can get"  . Drug use:     Frequency: 7.0 times per week    Types: IV, Cocaine, Marijuana, Methamphetamines, Heroin  . Sexual activity: Yes    Birth control/ protection: None   Other Topics Concern  . None   Social History Narrative  . None    Hospital Course:   Juan Robinson was admitted for Opioid dependence with withdrawal [facility](HCC)  , with psychosis and crisis management.  Pt was treated discharged with the medications listed below under Medication List.  Medical problems were identified and treated as needed.  Home medications were restarted as appropriate.  Improvement was monitored by observation and Juan Robinson.  Emotional and mental status was monitored by daily self-inventory reports completed by Juan Robinson and clinical staff.         Juan Freezereston Robinson was evaluated by the treatment team for stability and plans for continued recovery upon discharge. Juan Freezereston Robinson 's Robinson was an integral factor for scheduling further treatment. Employment, transportation, bed availability, health status, family support, and any pending legal issues were also considered during hospital stay. Pt was offered further treatment options upon discharge including but not limited to Residential, Intensive Outpatient, and Outpatient treatment.  Juan Freezereston Murtha will follow up with the services as listed below under Follow Up Information.    Upon completion of this admission the patient was both mentally and medically stable for discharge denying suicidal/homicidal ideation, auditory/visual/tactile hallucinations, delusional thoughts and paranoia.    Juan Robinson responded well to treatment with vistaril, seroquel, zoloft, trazodone, thiamine without adverse effects. Pt demonstrated improvement without reported or observed adverse effects to the point of stability appropriate for outpatient management. Pertinent labs include: UDS+ for barbiturates, benzo, opiates, cocaine, AST 90, ALT 162,  HDL 32, A1C 5.6 for which outpatient follow-up is necessary for lab recheck as mentioned below. Reviewed CBC, CMP, BAL, and UDS; all unremarkable aside from noted exceptions.   Physical Findings: AIMS: Facial and Oral Movements Muscles of Facial Expression: None, normal Lips and Perioral Area: None, normal Jaw: None,  normal Tongue: None, normal,Extremity Movements Upper (arms, wrists, hands, fingers): None, normal Lower (legs, knees, ankles, toes): None, normal, Trunk Movements Neck, shoulders, hips: None, normal, Overall Severity Severity of abnormal movements (highest score from questions above): None, normal Incapacitation due to abnormal movements: None, normal Patient's awareness of abnormal movements (rate only patient's report): No Awareness, Dental Status Current problems with teeth and/or dentures?: No Does patient usually wear dentures?: No  CIWA:  CIWA-Ar Total: 5 COWS:  COWS Total Score: 3  Musculoskeletal: Strength & Muscle Tone: within normal limits Gait & Station: normal Patient leans: N/A  Psychiatric Specialty Exam: Physical Exam  Review of Systems  Psychiatric/Behavioral: Positive for depression and substance abuse. Negative for hallucinations (resolving) and suicidal ideas. The patient is nervous/anxious and has insomnia.   All other systems reviewed and are negative.   Blood pressure 110/62, pulse 85, temperature 97.6 F (36.4 C), temperature source Oral, resp. rate 16, height 5\' 11"  (1.803 m), weight 74.8 kg (165 lb), SpO2 97 %.Body mass index is 23.01 kg/m.  SEE MD PSE WITHIN SRA  Have you used any form of tobacco in the last 30 days? (Cigarettes, Smokeless Tobacco, Cigars, and/or Pipes): Yes  Has this patient used any form of tobacco in the last  30 days? (Cigarettes, Smokeless Tobacco, Cigars, and/or Pipes) NO  Blood Alcohol level:  Lab Results  Component Value Date   ETH <5 12/26/2014   ETH  09/25/2009    <5        LOWEST DETECTABLE LIMIT FOR SERUM ALCOHOL IS 5 mg/dL FOR MEDICAL PURPOSES ONLY    Metabolic Disorder Labs:  Lab Results  Component Value Date   HGBA1C 5.6 11/27/2015   MPG 114 11/27/2015   No results found for: PROLACTIN Lab Results  Component Value Date   CHOL 94 11/27/2015   TRIG 103 11/27/2015   HDL 32 (L) 11/27/2015   CHOLHDL 2.9  11/27/2015   VLDL 21 11/27/2015   LDLCALC 41 11/27/2015    See Psychiatric Specialty Exam and Suicide Risk Assessment completed by Attending Physician prior to discharge.  Discharge destination:  Home  Is patient on multiple antipsychotic therapies at discharge:  No   Has Patient had three or more failed trials of antipsychotic monotherapy by history:  No  Recommended Plan for Multiple Antipsychotic Therapies: NA     Medication List    STOP taking these medications   ADVIL PM PO   amphetamine-dextroamphetamine 30 MG tablet Commonly known as:  ADDERALL   buPROPion 100 MG 12 hr tablet Commonly known as:  WELLBUTRIN SR   buPROPion 150 MG 12 hr tablet Commonly known as:  WELLBUTRIN SR   cloNIDine 0.1 MG tablet Commonly known as:  CATAPRES   cyclobenzaprine 10 MG tablet Commonly known as:  FLEXERIL   diazepam 10 MG tablet Commonly known as:  VALIUM   dicyclomine 20 MG tablet Commonly known as:  BENTYL   gabapentin 600 MG tablet Commonly known as:  NEURONTIN   methocarbamol 500 MG tablet Commonly known as:  ROBAXIN   ondansetron 4 MG disintegrating tablet Commonly known as:  ZOFRAN-ODT     TAKE these medications     Indication  hydrOXYzine 25 MG tablet Commonly known as:  ATARAX/VISTARIL Take 1 tablet (25 mg total) by mouth every 6 (six) hours as needed for anxiety.  Indication:  Anxiety Neurosis   multivitamin with minerals Tabs tablet Take 1 tablet by mouth daily.  Indication:  Vitamin Supplementation   QUEtiapine 100 MG tablet Commonly known as:  SEROQUEL Take 1 tablet (100 mg total) by mouth at bedtime. What changed:  Another medication with the same name was changed. Make sure you understand how and when to take each.  Indication:  psychosis   QUEtiapine 50 MG tablet Commonly known as:  SEROQUEL Take 1 tablet (50 mg total) by mouth daily. Start taking on:  12/01/2015 What changed:  when to take this  Indication:  psychosis and mood  stabilization   sertraline 25 MG tablet Commonly known as:  ZOLOFT Take 1 tablet (25 mg total) by mouth daily. Start taking on:  12/01/2015  Indication:  Major Depressive Disorder   thiamine 100 MG tablet Take 1 tablet (100 mg total) by mouth daily. Start taking on:  12/01/2015  Indication:  Deficiency in Thiamine or Vitamin B1   traZODone 100 MG tablet Commonly known as:  DESYREL Take 1 tablet (100 mg total) by mouth at bedtime as needed for sleep.  Indication:  Trouble Sleeping      Follow-up Information    [facility] .   Contact information: [address] [place] [address] [phone]        [facility] .   Why:  Walk in between 8:30AM-11:00AM Monday through Friday  for hospital follow-up and assessment for services (medication management/counseling/substance abuse). Thank you.  Contact information: [address] [place], [place] [address] Phone: [phone] Fax: [phone]          Follow-up recommendations:  Activity:  As tolerated Diet:  Heart healthy with low sodium  Comments:   Take all medications as prescribed. Keep all follow-up appointments as scheduled.  Do not consume alcohol or use illegal drugs while on prescription medications. Report any adverse effects from your medications to your primary care provider promptly.  In the event of recurrent symptoms or worsening symptoms, call 911, a crisis hotline, or go to the nearest emergency department for evaluation.   Signed: Beau FannyWithrow, Veatrice Eckstein C, FNP 11/30/2015, 12:43 PM

## 2015-11-30 NOTE — Plan of Care (Signed)
Problem: Medication: Goal: Compliance with prescribed medication regimen will improve Outcome: Progressing Compliant with medication regimen  Problem: Safety: Goal: Ability to remain free from injury will improve Outcome: Progressing Safety maintained on unit   

## 2015-11-30 NOTE — Progress Notes (Addendum)
  [facility] Discharge Plan :  Will you be returning to the same living situation after discharge:  Yes,  home At discharge, do you have transportation home?: Yes,  bus or family member. bus pass in chart Do you have the ability to pay for your medications: Yes,  mental health  Release of information consent forms completed and submitted to medical records by CSW. Patient to Follow up at: Follow-up Information           [facility] .   Why:  Walk in between 8:30AM-11:00AM Monday through Friday for hospital follow-up and assessment for services (medication management/counseling/substance abuse). Thank you.  Contact information: [address] [place], [place] [address] Phone: [phone] Fax: [phone]          Next level of care provider has access to [facility] Link:no  Safety Planning and Suicide Prevention discussed: Yes,  SPE completed with pt; contact attempts made with pt's brother.  Have you used any form of tobacco in the last 30 days? (Cigarettes, Smokeless Tobacco, Cigars, and/or Pipes): Yes  Has patient been referred to the Quitline?: Patient refused referral  Patient has been referred for addiction treatment: Yes  Thersa Mohiuddin N Smart LCSW 11/30/2015, 1:08 PM

## 2015-11-30 NOTE — Clinical Social Work Note (Signed)
Patient has a care coordinator, Reece LeaderMichelle  Hannah [facility].  Santa GeneraAnne Susette Seminara, LCSW Lead Clinical Social Worker Phone:  [phone]

## 2015-11-30 NOTE — Progress Notes (Signed)
D:  Patient awake and alert; oriented x 4; he denies suicidal and homicidal ideation and AVH; no self-injurous behaviors noted or reported. A:  Medications given as scheduled;  Emotional support provided; encouraged him to seek assistance with needs/concerns. R:  Safety maintained on unit. 

## 2015-11-30 NOTE — Progress Notes (Signed)
Written/verbal discharge instructions, prescriptions, sample medications and follow-up appointments given to patient with verbalization of understanding;  Patient denies suicidal and homicidal ideation. Suicide Prevention information/materials given to patient  All patient belongings returned to patient at time of discharge. Discharged home in stable condition.

## 2015-11-30 NOTE — Progress Notes (Signed)
Recreation Therapy Notes  Animal-Assisted Activity (AAA) Program Checklist/Progress Notes Patient Eligibility Criteria Checklist & Daily Group note for Rec TxIntervention  Date: 10.31.2017 Time: 2:45pm [facility] [address] [facility]    AAA/T Program Assumption of Risk Form signed by Patient/ or Parent Legal Guardian Yes  Patient is free of allergies or sever asthma Yes  Patient reports no fear of animals Yes  Patient reports no history of cruelty to animals Yes  Patient understands his/her participation is voluntary Yes  Behavioral Response: Did not attend.   Marykay Lexenise L Kindrick Lankford, LRT/CTRS  Alison Kubicki L 11/30/2015 2:58 PM

## 2016-09-13 ENCOUNTER — Encounter: Payer: Self-pay | Admitting: Emergency Medicine

## 2016-09-13 ENCOUNTER — Emergency Department: Payer: Self-pay

## 2016-09-13 ENCOUNTER — Emergency Department
Admission: EM | Admit: 2016-09-13 | Discharge: 2016-09-13 | Disposition: A | Discharge status: Home / Self Care | Payer: Self-pay | Attending: Emergency Medicine | Admitting: Emergency Medicine

## 2016-09-13 DIAGNOSIS — Z79899 Other long term (current) drug therapy: Secondary | ICD-10-CM | POA: Insufficient documentation

## 2016-09-13 DIAGNOSIS — R079 Chest pain, unspecified: Secondary | ICD-10-CM | POA: Insufficient documentation

## 2016-09-13 DIAGNOSIS — R51 Headache: Secondary | ICD-10-CM | POA: Insufficient documentation

## 2016-09-13 DIAGNOSIS — S0083XA Contusion of other part of head, initial encounter: Secondary | ICD-10-CM | POA: Insufficient documentation

## 2016-09-13 DIAGNOSIS — Y939 Activity, unspecified: Secondary | ICD-10-CM | POA: Insufficient documentation

## 2016-09-13 DIAGNOSIS — T07XXXA Unspecified multiple injuries, initial encounter: Secondary | ICD-10-CM | POA: Insufficient documentation

## 2016-09-13 DIAGNOSIS — Y998 Other external cause status: Secondary | ICD-10-CM | POA: Insufficient documentation

## 2016-09-13 DIAGNOSIS — M25552 Pain in left hip: Secondary | ICD-10-CM | POA: Insufficient documentation

## 2016-09-13 DIAGNOSIS — Y92149 Unspecified place in prison as the place of occurrence of the external cause: Secondary | ICD-10-CM | POA: Insufficient documentation

## 2016-09-13 DIAGNOSIS — S32009A Unspecified fracture of unspecified lumbar vertebra, initial encounter for closed fracture: Secondary | ICD-10-CM | POA: Insufficient documentation

## 2016-09-13 LAB — URINALYSIS, COMPLETE (UACMP) WITH MICROSCOPIC
BACTERIA UA: NONE SEEN
Bilirubin Urine: NEGATIVE
Glucose, UA: NEGATIVE mg/dL
Ketones, ur: 5 mg/dL — AB
Leukocytes, UA: NEGATIVE
Nitrite: NEGATIVE
PROTEIN: NEGATIVE mg/dL
pH: 7 (ref 5.0–8.0)

## 2016-09-13 LAB — CBC WITH DIFFERENTIAL/PLATELET
BASOS PCT: 1 %
Basophils Absolute: 0.1 10*3/uL (ref 0–0.1)
EOS ABS: 0.1 10*3/uL (ref 0–0.7)
EOS PCT: 1 %
HCT: 44.6 % (ref 40.0–52.0)
HEMOGLOBIN: 15.2 g/dL (ref 13.0–18.0)
LYMPHS ABS: 1.1 10*3/uL (ref 1.0–3.6)
Lymphocytes Relative: 10 %
MCH: 31.4 pg (ref 26.0–34.0)
MCHC: 34.1 g/dL (ref 32.0–36.0)
MCV: 92 fL (ref 80.0–100.0)
MONO ABS: 1.1 10*3/uL — AB (ref 0.2–1.0)
MONOS PCT: 9 %
Neutro Abs: 9.4 10*3/uL — ABNORMAL HIGH (ref 1.4–6.5)
Neutrophils Relative %: 79 %
Platelets: 303 10*3/uL (ref 150–440)
RBC: 4.84 MIL/uL (ref 4.40–5.90)
RDW: 13 % (ref 11.5–14.5)
WBC: 11.8 10*3/uL — ABNORMAL HIGH (ref 3.8–10.6)

## 2016-09-13 LAB — COMPREHENSIVE METABOLIC PANEL
ALT: 26 U/L (ref 17–63)
AST: 28 U/L (ref 15–41)
Albumin: 4.5 g/dL (ref 3.5–5.0)
Alkaline Phosphatase: 51 U/L (ref 38–126)
Anion gap: 9 (ref 5–15)
BUN: 14 mg/dL (ref 6–20)
CALCIUM: 9.8 mg/dL (ref 8.9–10.3)
CHLORIDE: 102 mmol/L (ref 101–111)
CO2: 25 mmol/L (ref 22–32)
CREATININE: 1.2 mg/dL (ref 0.61–1.24)
GFR calc non Af Amer: >60 mL/min (ref >60)
GLUCOSE: 101 mg/dL — AB (ref 65–99)
Potassium: 4.1 mmol/L (ref 3.5–5.1)
SODIUM: 136 mmol/L (ref 135–145)
Total Bilirubin: 0.7 mg/dL (ref 0.3–1.2)
Total Protein: 8.7 g/dL — ABNORMAL HIGH (ref 6.5–8.1)

## 2016-09-13 MED ORDER — IOPAMIDOL (ISOVUE-370) INJECTION 76%
100.0000 mL | Freq: Once | INTRAVENOUS | Status: AC | PRN
  Administered 2016-09-13: 100 mL via INTRAVENOUS
  Filled 2016-09-13: qty 100

## 2016-09-13 MED ORDER — SODIUM CHLORIDE 0.9 % IV BOLUS (SEPSIS)
1000.0000 mL | Freq: Once | INTRAVENOUS | Status: AC
  Administered 2016-09-13: 1000 mL via INTRAVENOUS

## 2016-09-13 MED ORDER — FENTANYL CITRATE (PF) 100 MCG/2ML IJ SOLN
50.0000 ug | Freq: Once | INTRAMUSCULAR | Status: AC
  Administered 2016-09-13: 50 ug via INTRAVENOUS
  Filled 2016-09-13: qty 2

## 2016-09-13 MED ORDER — ONDANSETRON HCL 4 MG/2ML IJ SOLN
4.0000 mg | Freq: Once | INTRAMUSCULAR | Status: AC
  Administered 2016-09-13: 4 mg via INTRAVENOUS
  Filled 2016-09-13: qty 2

## 2016-09-13 NOTE — ED Provider Notes (Signed)
ED ECG REPORT I, Alaia Lordi, the attending physician, personally viewed and interpreted this ECG.  Date: 09/13/2016 EKG Time: 20:11 Rate: 84 Rhythm: normal sinus rhythm QRS Axis: normal Intervals: normal ST/T Wave abnormalities: normal Narrative Interpretation: unremarkable    Loleta RoseForbach, Rishaan Gunner, MD 09/13/16 2017
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
